# Patient Record
Sex: Male | Born: 1935
Health system: Southern US, Community
[De-identification: ages and names within clinical notes are randomized; demographics above are authoritative.]

## PROBLEM LIST (undated history)

## (undated) DIAGNOSIS — R0989 Other specified symptoms and signs involving the circulatory and respiratory systems: Secondary | ICD-10-CM

## (undated) DIAGNOSIS — I10 Essential (primary) hypertension: Secondary | ICD-10-CM

## (undated) DIAGNOSIS — J90 Pleural effusion, not elsewhere classified: Secondary | ICD-10-CM

## (undated) DIAGNOSIS — I517 Cardiomegaly: Secondary | ICD-10-CM

## (undated) DIAGNOSIS — I4510 Unspecified right bundle-branch block: Secondary | ICD-10-CM

## (undated) DIAGNOSIS — E785 Hyperlipidemia, unspecified: Secondary | ICD-10-CM

## (undated) DIAGNOSIS — I5189 Other ill-defined heart diseases: Secondary | ICD-10-CM

## (undated) DIAGNOSIS — I8390 Asymptomatic varicose veins of unspecified lower extremity: Secondary | ICD-10-CM

## (undated) DIAGNOSIS — I509 Heart failure, unspecified: Secondary | ICD-10-CM

## (undated) DIAGNOSIS — E119 Type 2 diabetes mellitus without complications: Secondary | ICD-10-CM

## (undated) DIAGNOSIS — I482 Chronic atrial fibrillation, unspecified: Secondary | ICD-10-CM

## (undated) DIAGNOSIS — R609 Edema, unspecified: Secondary | ICD-10-CM

## (undated) HISTORY — DX: Hyperlipidemia, unspecified: E78.5

## (undated) HISTORY — DX: Asymptomatic varicose veins of unspecified lower extremity: I83.90

## (undated) HISTORY — DX: Essential (primary) hypertension: I10

## (undated) HISTORY — DX: Pleural effusion, not elsewhere classified: J90

## (undated) HISTORY — PX: VIDEO ASSISTED THORACOSCOPY: SHX5073

## (undated) HISTORY — DX: Other ill-defined heart diseases: I51.89

## (undated) HISTORY — DX: Unspecified right bundle-branch block: I45.10

## (undated) HISTORY — PX: HEMORROIDECTOMY: SUR656

## (undated) HISTORY — DX: Chronic atrial fibrillation, unspecified: I48.20

## (undated) HISTORY — DX: Heart failure, unspecified: I50.9

## (undated) HISTORY — DX: Other specified symptoms and signs involving the circulatory and respiratory systems: R09.89

## (undated) HISTORY — DX: Edema, unspecified: R60.9

## (undated) HISTORY — DX: Type 2 diabetes mellitus without complications: E11.9

## (undated) HISTORY — DX: Cardiomegaly: I51.7

---

## 2008-10-06 DIAGNOSIS — I4510 Unspecified right bundle-branch block: Secondary | ICD-10-CM

## 2008-10-06 HISTORY — DX: Unspecified right bundle-branch block: I45.10

## 2009-07-23 ENCOUNTER — Inpatient Hospital Stay (HOSPITAL_COMMUNITY): Admission: EM | Admit: 2009-07-23 | Discharge: 2009-07-27 | Payer: Self-pay | Admitting: Emergency Medicine

## 2009-07-24 ENCOUNTER — Encounter (INDEPENDENT_AMBULATORY_CARE_PROVIDER_SITE_OTHER): Payer: Self-pay | Admitting: Internal Medicine

## 2009-07-24 DIAGNOSIS — I509 Heart failure, unspecified: Secondary | ICD-10-CM

## 2009-07-24 HISTORY — DX: Heart failure, unspecified: I50.9

## 2009-07-26 ENCOUNTER — Encounter (INDEPENDENT_AMBULATORY_CARE_PROVIDER_SITE_OTHER): Payer: Self-pay | Admitting: Internal Medicine

## 2009-07-30 ENCOUNTER — Ambulatory Visit (HOSPITAL_COMMUNITY): Admission: RE | Admit: 2009-07-30 | Discharge: 2009-07-30 | Payer: Self-pay | Admitting: Internal Medicine

## 2009-08-03 ENCOUNTER — Ambulatory Visit (HOSPITAL_COMMUNITY): Admission: RE | Admit: 2009-08-03 | Discharge: 2009-08-03 | Payer: Self-pay | Admitting: Internal Medicine

## 2009-08-13 ENCOUNTER — Encounter: Admission: RE | Admit: 2009-08-13 | Discharge: 2009-08-13 | Payer: Self-pay | Admitting: Cardiology

## 2010-03-14 ENCOUNTER — Ambulatory Visit
Admission: RE | Admit: 2010-03-14 | Discharge: 2010-03-14 | Disposition: A | Discharge status: Home / Self Care | Payer: PRIVATE HEALTH INSURANCE | Source: Ambulatory Visit | Attending: Internal Medicine | Admitting: Internal Medicine

## 2010-03-14 ENCOUNTER — Other Ambulatory Visit: Payer: Self-pay | Admitting: Internal Medicine

## 2010-03-14 DIAGNOSIS — R0600 Dyspnea, unspecified: Secondary | ICD-10-CM

## 2010-03-20 DIAGNOSIS — I8390 Asymptomatic varicose veins of unspecified lower extremity: Secondary | ICD-10-CM

## 2010-03-20 HISTORY — DX: Asymptomatic varicose veins of unspecified lower extremity: I83.90

## 2010-03-30 LAB — GLUCOSE, CAPILLARY: Glucose-Capillary: 129 mg/dL — ABNORMAL HIGH (ref 70–99)

## 2010-03-30 LAB — PROTIME-INR: Prothrombin Time: 20.9 seconds — ABNORMAL HIGH (ref 11.6–15.2)

## 2010-03-31 LAB — BODY FLUID CULTURE: Culture: NO GROWTH

## 2010-03-31 LAB — DIFFERENTIAL
Basophils Absolute: 0 10*3/uL (ref 0.0–0.1)
Basophils Relative: 1 % (ref 0–1)
Eosinophils Absolute: 0.1 10*3/uL (ref 0.0–0.7)
Eosinophils Relative: 1 % (ref 0–5)
Lymphocytes Relative: 19 % (ref 12–46)
Monocytes Relative: 7 % (ref 3–12)
Neutrophils Relative %: 72 % (ref 43–77)

## 2010-03-31 LAB — CK TOTAL AND CKMB (NOT AT ARMC): CK, MB: 1.8 ng/mL (ref 0.3–4.0)

## 2010-03-31 LAB — LIPID PANEL
LDL Cholesterol: 33 mg/dL (ref 0–99)
Total CHOL/HDL Ratio: 2 RATIO
Triglycerides: 53 mg/dL (ref <150)
VLDL: 11 mg/dL (ref 0–40)

## 2010-03-31 LAB — BASIC METABOLIC PANEL
BUN: 12 mg/dL (ref 6–23)
Calcium: 9 mg/dL (ref 8.4–10.5)
GFR calc Af Amer: >60 mL/min (ref >60)
GFR calc Af Amer: >60 mL/min (ref >60)
GFR calc non Af Amer: 51 mL/min — ABNORMAL LOW (ref >60)
GFR calc non Af Amer: >60 mL/min (ref >60)
Potassium: 3.4 mEq/L — ABNORMAL LOW (ref 3.5–5.1)
Sodium: 140 mEq/L (ref 135–145)

## 2010-03-31 LAB — CBC
HCT: 42.8 % (ref 39.0–52.0)
Hemoglobin: 14.1 g/dL (ref 13.0–17.0)
MCH: 31.4 pg (ref 26.0–34.0)
MCHC: 33.4 g/dL (ref 30.0–36.0)
MCHC: 33.4 g/dL (ref 30.0–36.0)
MCV: 94 fL (ref 78.0–100.0)
Platelets: 110 10*3/uL — ABNORMAL LOW (ref 150–400)
RDW: 16.6 % — ABNORMAL HIGH (ref 11.5–15.5)
RDW: 16.9 % — ABNORMAL HIGH (ref 11.5–15.5)

## 2010-03-31 LAB — COMPREHENSIVE METABOLIC PANEL
AST: 21 U/L (ref 0–37)
Albumin: 3.2 g/dL — ABNORMAL LOW (ref 3.5–5.2)
Calcium: 8.6 mg/dL (ref 8.4–10.5)
Chloride: 103 mEq/L (ref 96–112)
GFR calc Af Amer: >60 mL/min (ref >60)
GFR calc non Af Amer: >60 mL/min (ref >60)
Total Bilirubin: 0.6 mg/dL (ref 0.3–1.2)

## 2010-03-31 LAB — HEPATIC FUNCTION PANEL
AST: 24 U/L (ref 0–37)
Albumin: 3.3 g/dL — ABNORMAL LOW (ref 3.5–5.2)
Bilirubin, Direct: 0.3 mg/dL (ref 0.0–0.3)

## 2010-03-31 LAB — URINALYSIS, ROUTINE W REFLEX MICROSCOPIC
Bilirubin Urine: NEGATIVE
Hgb urine dipstick: NEGATIVE
Protein, ur: NEGATIVE mg/dL
Specific Gravity, Urine: 1.02 (ref 1.005–1.030)
pH: 5.5 (ref 5.0–8.0)

## 2010-03-31 LAB — GLUCOSE, CAPILLARY
Glucose-Capillary: 126 mg/dL — ABNORMAL HIGH (ref 70–99)
Glucose-Capillary: 130 mg/dL — ABNORMAL HIGH (ref 70–99)
Glucose-Capillary: 136 mg/dL — ABNORMAL HIGH (ref 70–99)
Glucose-Capillary: 160 mg/dL — ABNORMAL HIGH (ref 70–99)
Glucose-Capillary: 190 mg/dL — ABNORMAL HIGH (ref 70–99)
Glucose-Capillary: 85 mg/dL (ref 70–99)
Glucose-Capillary: 86 mg/dL (ref 70–99)
Glucose-Capillary: 88 mg/dL (ref 70–99)

## 2010-03-31 LAB — CARDIAC PANEL(CRET KIN+CKTOT+MB+TROPI)
CK, MB: 1.5 ng/mL (ref 0.3–4.0)
CK, MB: 1.5 ng/mL (ref 0.3–4.0)
Relative Index: INVALID (ref 0.0–2.5)
Relative Index: INVALID (ref 0.0–2.5)
Troponin I: 0.07 ng/mL — ABNORMAL HIGH (ref 0.00–0.06)
Troponin I: 0.07 ng/mL — ABNORMAL HIGH (ref 0.00–0.06)

## 2010-03-31 LAB — TROPONIN I: Troponin I: 0.05 ng/mL (ref 0.00–0.06)

## 2010-03-31 LAB — HEMOGLOBIN A1C: Mean Plasma Glucose: 120 mg/dL — ABNORMAL HIGH (ref <117)

## 2010-03-31 LAB — URINE MICROSCOPIC-ADD ON

## 2010-03-31 LAB — BODY FLUID CELL COUNT WITH DIFFERENTIAL
Neutrophil Count, Fluid: 2 % (ref 0–25)
Total Nucleated Cell Count, Fluid: 740 cu mm (ref 0–1000)

## 2010-03-31 LAB — BRAIN NATRIURETIC PEPTIDE: Pro B Natriuretic peptide (BNP): 432 pg/mL — ABNORMAL HIGH (ref 0.0–100.0)

## 2010-03-31 LAB — LACTATE DEHYDROGENASE, PLEURAL OR PERITONEAL FLUID

## 2010-04-18 ENCOUNTER — Encounter: Payer: Self-pay | Admitting: Pulmonary Disease

## 2010-04-22 ENCOUNTER — Ambulatory Visit (INDEPENDENT_AMBULATORY_CARE_PROVIDER_SITE_OTHER): Payer: PRIVATE HEALTH INSURANCE | Admitting: Pulmonary Disease

## 2010-04-22 ENCOUNTER — Encounter: Payer: Self-pay | Admitting: Pulmonary Disease

## 2010-04-22 VITALS — BP 118/72 | HR 55 | Temp 97.7°F | Ht 69.0 in | Wt 244.0 lb

## 2010-04-22 DIAGNOSIS — I4891 Unspecified atrial fibrillation: Secondary | ICD-10-CM

## 2010-04-22 DIAGNOSIS — J90 Pleural effusion, not elsewhere classified: Secondary | ICD-10-CM

## 2010-04-22 DIAGNOSIS — I519 Heart disease, unspecified: Secondary | ICD-10-CM

## 2010-04-22 DIAGNOSIS — I5189 Other ill-defined heart diseases: Secondary | ICD-10-CM

## 2010-04-22 NOTE — Progress Notes (Signed)
  Subjective:    Patient ID: Alex [name], male    DOB: 07-23-1920, 77 y.o.   MRN: [id number]  HPI The pt is an 77y/o male who I have been asked to see for a chronic right effusion.  This was first noted in summer of 2011, and thoracentesis at that time c/w transudate but had lymphocytosis on differential.  He had residual effusion after tap, but over time the fluid has re-accumulated.  His most recent cxr shows moderate right effusion that has enlarged from his usual baseline.  The pt denies chest discomfort, but feels more fatigued with a little more doe.  He denies any cough, congestion, or mucus.  He has persistent LE edema, and a h/o CAF with chronic diastolic heart failure.  He denies any h/o TB exposure, and has never had a ppd placed.    Review of Systems  Constitutional: Negative for fever and unexpected weight change.  HENT: Positive for congestion, rhinorrhea and postnasal drip. Negative for ear pain, nosebleeds, sore throat, sneezing, trouble swallowing, dental problem and sinus pressure.   Eyes: Negative for redness and itching.  Respiratory: Positive for cough and shortness of breath. Negative for chest tightness and wheezing.   Cardiovascular: Negative for palpitations and leg swelling.  Gastrointestinal: Negative for nausea and vomiting.  Genitourinary: Negative for dysuria.  Musculoskeletal: Negative for joint swelling.  Skin: Negative for rash.  Neurological: Negative for headaches.  Hematological: Does not bruise/bleed easily.  Psychiatric/Behavioral: Negative for dysphoric mood. The patient is not nervous/anxious.        Objective:   Physical Exam Constitutional:  Well developed, no acute distress  HENT:  Nares patent without discharge  Oropharynx without exudate, palate and uvula are normal  Eyes:  Perrla, eomi, no scleral icterus  Neck:  No JVD, no TMG  Cardiovascular:  Irregular rhythm but CVR, no rubs or gallops.  No murmurs        Intact distal  pulses  Pulmonary :  Decreased bs in right base with dullness, no stridor or respiratory distress   No rales, rhonchi, or wheezing  Abdominal:  Soft, nondistended, bowel sounds present.  No tenderness noted.   Musculoskeletal:  + lower extremity edema noted.  Lymph Nodes:  No cervical lymphadenopathy noted  Skin:  No cyanosis noted  Neurologic:  Alert, appropriate, moves all 4 extremities without obvious deficit.         Assessment & Plan:

## 2010-04-22 NOTE — Patient Instructions (Signed)
Will send to radiology to have your fluid removed, and send for lab studies. Will place ppd, and return on wed to have checked. Will call you with results of fluid removal

## 2010-04-23 ENCOUNTER — Ambulatory Visit (HOSPITAL_COMMUNITY)
Admission: RE | Admit: 2010-04-23 | Discharge: 2010-04-23 | Disposition: A | Discharge status: Home / Self Care | Payer: Medicare Other | Source: Ambulatory Visit | Attending: Pulmonary Disease | Admitting: Pulmonary Disease

## 2010-04-23 ENCOUNTER — Other Ambulatory Visit: Payer: Self-pay | Admitting: Interventional Radiology

## 2010-04-23 ENCOUNTER — Other Ambulatory Visit: Payer: Self-pay | Admitting: Pulmonary Disease

## 2010-04-23 DIAGNOSIS — R0989 Other specified symptoms and signs involving the circulatory and respiratory systems: Secondary | ICD-10-CM | POA: Insufficient documentation

## 2010-04-23 DIAGNOSIS — J90 Pleural effusion, not elsewhere classified: Secondary | ICD-10-CM | POA: Insufficient documentation

## 2010-04-23 DIAGNOSIS — R0609 Other forms of dyspnea: Secondary | ICD-10-CM | POA: Insufficient documentation

## 2010-04-23 LAB — LACTATE DEHYDROGENASE, PLEURAL OR PERITONEAL FLUID: LD, Fluid: 109 U/L — ABNORMAL HIGH (ref 3–23)

## 2010-04-23 LAB — BODY FLUID CELL COUNT WITH DIFFERENTIAL: Total Nucleated Cell Count, Fluid: 2543 cu mm — ABNORMAL HIGH (ref 0–1000)

## 2010-04-23 LAB — PROTEIN, BODY FLUID: Total protein, fluid: 3.2 g/dL

## 2010-04-23 LAB — GLUCOSE, SEROUS FLUID: Glucose, Fluid: 88 mg/dL

## 2010-04-24 ENCOUNTER — Encounter: Payer: Self-pay | Admitting: Pulmonary Disease

## 2010-04-24 DIAGNOSIS — I4891 Unspecified atrial fibrillation: Secondary | ICD-10-CM | POA: Insufficient documentation

## 2010-04-24 DIAGNOSIS — I5189 Other ill-defined heart diseases: Secondary | ICD-10-CM | POA: Insufficient documentation

## 2010-04-24 LAB — TB SKIN TEST: Induration: 0

## 2010-04-24 NOTE — Progress Notes (Signed)
Addended by: Arman Filter on: 04/24/2010 04:51 PM   Modules accepted: Orders

## 2010-04-24 NOTE — Assessment & Plan Note (Signed)
The pt has a chronic right effusion from the summer of 2011 that has never resolved.  It was transudative in nature at last tap, though did have elevated cell count with lymphocytosis.  It has clearly enlarged from last cxr, and the pt is a little more dyspneic.  More than likely, this is due to chronic heart failure given the characteristics of the fluid and his cardiac history, however would also consider other causes of a lymphocytic effusion such as tuberculosis and lymphoma (although they are usually exudative).  Will schedule for repeat thoracentesis to help with symptoms, and to repeat various studies to try and pin down possible etiology.

## 2010-04-26 LAB — BODY FLUID CULTURE

## 2010-04-30 ENCOUNTER — Other Ambulatory Visit: Payer: Self-pay | Admitting: Pulmonary Disease

## 2010-04-30 DIAGNOSIS — T887XXA Unspecified adverse effect of drug or medicament, initial encounter: Secondary | ICD-10-CM

## 2010-04-30 DIAGNOSIS — J90 Pleural effusion, not elsewhere classified: Secondary | ICD-10-CM

## 2010-05-02 ENCOUNTER — Other Ambulatory Visit (INDEPENDENT_AMBULATORY_CARE_PROVIDER_SITE_OTHER): Payer: PRIVATE HEALTH INSURANCE

## 2010-05-02 DIAGNOSIS — T887XXA Unspecified adverse effect of drug or medicament, initial encounter: Secondary | ICD-10-CM

## 2010-05-02 LAB — BASIC METABOLIC PANEL
CO2: 30 mEq/L (ref 19–32)
Glucose, Bld: 142 mg/dL — ABNORMAL HIGH (ref 70–99)
Potassium: 4.3 mEq/L (ref 3.5–5.1)
Sodium: 141 mEq/L (ref 135–145)

## 2010-05-06 ENCOUNTER — Ambulatory Visit (INDEPENDENT_AMBULATORY_CARE_PROVIDER_SITE_OTHER)
Admission: RE | Admit: 2010-05-06 | Discharge: 2010-05-06 | Disposition: A | Discharge status: Home / Self Care | Payer: PRIVATE HEALTH INSURANCE | Source: Ambulatory Visit | Attending: Pulmonary Disease | Admitting: Pulmonary Disease

## 2010-05-06 DIAGNOSIS — J90 Pleural effusion, not elsewhere classified: Secondary | ICD-10-CM

## 2010-05-06 MED ORDER — IOHEXOL 300 MG/ML  SOLN
100.0000 mL | Freq: Once | INTRAMUSCULAR | Status: AC | PRN
  Administered 2010-05-06: 80 mL via INTRAVENOUS

## 2010-05-08 ENCOUNTER — Encounter: Payer: Self-pay | Admitting: Pulmonary Disease

## 2010-05-10 ENCOUNTER — Encounter: Payer: Self-pay | Admitting: Pulmonary Disease

## 2010-05-10 ENCOUNTER — Ambulatory Visit (INDEPENDENT_AMBULATORY_CARE_PROVIDER_SITE_OTHER): Payer: PRIVATE HEALTH INSURANCE | Admitting: Pulmonary Disease

## 2010-05-10 VITALS — BP 120/70 | HR 89 | Temp 98.4°F | Ht 69.0 in | Wt 246.2 lb

## 2010-05-10 DIAGNOSIS — I5189 Other ill-defined heart diseases: Secondary | ICD-10-CM

## 2010-05-10 DIAGNOSIS — J90 Pleural effusion, not elsewhere classified: Secondary | ICD-10-CM

## 2010-05-10 DIAGNOSIS — I519 Heart disease, unspecified: Secondary | ICD-10-CM

## 2010-05-10 NOTE — Assessment & Plan Note (Addendum)
The pt's right effusion has re-accumulated fairly quickly by ct, with compressive atx in RLL.  His recent tap shows a little more inflammation, but is still transudative.  It still has a significant lymphocytosis.  His ppd is negative.  At this point, it is unclear why this keeps recurring, and why a lymphocytic effusion.  Must consider a diagnosis of lymphoma, although tends to be more exudative usually.  Have considered possible chronic heart failure, but his echo last year was unimpressive except for diastolic dysfunction.  He does keep some LE edema.  At this point, are considering whether to retap for fluid to send for flow cytometry to r/o lymphoma and have cardiology work with him on more aggressive diuresis, versus referring to thoracic surgery for vats with pleural bx and possible talc pleurodesis to prevent recurrence.  The pt is 77y/o, on coumadin, and does have heart issues.  I would like to try the former first, and see how he does.  If his flow cytometry is normal, he will obviously need further workup.  If the fluid re-accumulates quickly despite attempts by cardiology to diurese more aggressively, would refer to thoracic surgery.

## 2010-05-10 NOTE — Patient Instructions (Signed)
Will tap your fluid off one more time, and send test for lymphoma Will get you an apptm with cardiology asap to help with getting fluid off IF the fluid comes back quickly, will need to go see thoracic surgeon regarding intervention Will call you once I get results of study back.

## 2010-05-10 NOTE — Progress Notes (Signed)
  Subjective:    Patient ID: Alex [name], male    DOB: 25-Nov-1920, 77 y.o.   MRN: [id number]  HPI The pt comes in today for f/u of his pleural effusion.  He has had since 07/2009, but has enlarged over time.  His recent thoracentesis showed primarily a transudate, but more inflammatory than last year.  He continues to have a lymphocytosis in the fluid, but his ppd is negative.  His f/u ct chest shows re-accumulation of the fluid, and rounded atx in RLL.  No significant LN.  The pt also has a h/o diastolic dysfunction, and does have persistent LE edema.     Review of Systems  Constitutional: Negative for fever and unexpected weight change.  HENT: Positive for rhinorrhea. Negative for ear pain, nosebleeds, congestion, sore throat, sneezing, trouble swallowing, dental problem, postnasal drip and sinus pressure.   Eyes: Negative for redness and itching.  Respiratory: Positive for shortness of breath. Negative for cough, chest tightness and wheezing.   Cardiovascular: Negative for palpitations and leg swelling.  Gastrointestinal: Negative for nausea and vomiting.  Genitourinary: Negative for dysuria.  Musculoskeletal: Negative for joint swelling.  Skin: Negative for rash.  Neurological: Negative for headaches.  Hematological: Does not bruise/bleed easily.  Psychiatric/Behavioral: Negative for dysphoric mood. The patient is not nervous/anxious.        Objective:   Physical Exam Ow male in nad Nares without discharge or purulence Chest with decreased bs right base with dullness, o/w clear Cor with cvr LE with 1+ edema bilat, no cyanosis  Alert and oriented, moves all 4        Assessment & Plan:

## 2010-05-14 ENCOUNTER — Other Ambulatory Visit: Payer: Self-pay | Admitting: Pulmonary Disease

## 2010-05-14 ENCOUNTER — Ambulatory Visit (HOSPITAL_COMMUNITY)
Admission: RE | Admit: 2010-05-14 | Discharge: 2010-05-14 | Disposition: A | Discharge status: Home / Self Care | Payer: Medicare Other | Source: Ambulatory Visit | Attending: Pulmonary Disease | Admitting: Pulmonary Disease

## 2010-05-14 ENCOUNTER — Other Ambulatory Visit: Payer: Self-pay | Admitting: Interventional Radiology

## 2010-05-14 DIAGNOSIS — Z9889 Other specified postprocedural states: Secondary | ICD-10-CM

## 2010-05-14 DIAGNOSIS — J90 Pleural effusion, not elsewhere classified: Secondary | ICD-10-CM | POA: Insufficient documentation

## 2010-05-22 ENCOUNTER — Encounter: Payer: Self-pay | Admitting: Pulmonary Disease

## 2010-05-22 DIAGNOSIS — J90 Pleural effusion, not elsewhere classified: Secondary | ICD-10-CM

## 2010-05-22 NOTE — Progress Notes (Signed)
Pt had greater than one liter of fluid removed again by radiology 05/14/10, and the flow cytometry did not show a monoclonal population of lymphs He has seen cardiology who has also increased his lasix to see if we can keep fluid from re-accumulating.  I have discussed results with pt, and he is to make an apptm to see me back in 4 weeks, and will get cxr same day.  If fluid comes back quickly, will need to see [place] for VATS

## 2010-05-23 ENCOUNTER — Telehealth: Payer: Self-pay | Admitting: Pulmonary Disease

## 2010-05-23 NOTE — Telephone Encounter (Signed)
[id number] x1 

## 2010-05-23 NOTE — Telephone Encounter (Signed)
Spoke with pt.  He states has appt with 2 different cards docs sched for June. He wants to know which on he should see.  I advised that KC wanted him to be seen by cards asap, and so should keep which ever appt is sooner. Pt verbalized understanding.

## 2010-06-18 ENCOUNTER — Encounter: Payer: Self-pay | Admitting: Pulmonary Disease

## 2010-06-20 ENCOUNTER — Encounter: Payer: Self-pay | Admitting: Pulmonary Disease

## 2010-06-20 ENCOUNTER — Ambulatory Visit (INDEPENDENT_AMBULATORY_CARE_PROVIDER_SITE_OTHER)
Admission: RE | Admit: 2010-06-20 | Discharge: 2010-06-20 | Disposition: A | Discharge status: Home / Self Care | Payer: Medicare Other | Source: Ambulatory Visit | Attending: Pulmonary Disease | Admitting: Pulmonary Disease

## 2010-06-20 ENCOUNTER — Ambulatory Visit (INDEPENDENT_AMBULATORY_CARE_PROVIDER_SITE_OTHER): Payer: Medicare Other | Admitting: Pulmonary Disease

## 2010-06-20 VITALS — BP 112/68 | HR 88 | Temp 98.2°F | Ht 69.0 in | Wt 237.4 lb

## 2010-06-20 DIAGNOSIS — J90 Pleural effusion, not elsewhere classified: Secondary | ICD-10-CM

## 2010-06-20 NOTE — Patient Instructions (Signed)
Will try and talk with Dr. Rennis Golden tomorrow about your fluid issues Will call you if able to speak with him

## 2010-06-20 NOTE — Progress Notes (Signed)
  Subjective:    Patient ID: Alex [name], male    DOB: March 07, 1920, 77 y.o.   MRN: [id number]  HPI The pt comes in today for f/u of his chronic right effusion.  This has been tapped multiple times, and recurs each time.  At the last visit, it was discussed whether to proceed with thoracic surgery vs trying to "tune him up" from a CV standpoint.  He has known diastolic dysfunction, and has had worsening LE edema of late.  He was referred back to his cardiologist, and per the pt he was diuresed more aggressively.  His LE edema has improved, but his pleural effusion is unchanged by cxr today.  At least the pt is asymptomatic.  He also tells me he may require cardioversion.   Review of Systems  Constitutional: Negative for fever and unexpected weight change.  HENT: Positive for rhinorrhea. Negative for ear pain, nosebleeds, congestion, sore throat, sneezing, trouble swallowing, dental problem, postnasal drip and sinus pressure.   Eyes: Negative for redness and itching.  Respiratory: Positive for cough. Negative for chest tightness, shortness of breath and wheezing.   Cardiovascular: Negative for palpitations and leg swelling.  Gastrointestinal: Negative for nausea and vomiting.  Genitourinary: Negative for dysuria.  Musculoskeletal: Negative for joint swelling.  Skin: Negative for rash.  Neurological: Negative for headaches.  Hematological: Does not bruise/bleed easily.  Psychiatric/Behavioral: Negative for dysphoric mood. The patient is not nervous/anxious.        Objective:   Physical Exam Ow male in nad Chest with decreased bs right base, no wheezing noted Cor with primarily regular rhythm, intermittent irreg. LE with 2+ edema, no cyanosis Alert, oriented, moves all 4        Assessment & Plan:

## 2010-06-25 ENCOUNTER — Encounter (INDEPENDENT_AMBULATORY_CARE_PROVIDER_SITE_OTHER): Payer: Medicare Other | Admitting: Thoracic Surgery

## 2010-06-25 DIAGNOSIS — J90 Pleural effusion, not elsewhere classified: Secondary | ICD-10-CM

## 2010-06-26 NOTE — Letter (Signed)
June 25, 2010  Barbaraann Share, MD, FCCP [address] [place] [address]  Re:  MIRL, Alex [name]               DOB:  [birth year]  Dear. Dr. Shelle Iron:  The patient was referred here for his recurrent right pleural effusion that has been going on for about 9 months.  On a CT scan done about a month ago, he could see rounded atelectasis in the right middle lobe and right lower lobe, multiple thoracentesis, he had no cancer.  Her is referred here for possible talc pleurodesis.  He has history of cardiac failure with chronic atrial fibrillation that was treated by Dr. Lynnea Ferrier until Dr. Daneil Dan left and he is 58 but very active.  PAST MEDICAL HISTORY:  MEDICATIONS:  He is on allopurinol, colchicine for gout, diltiazem, Vytorin, Lasix, glipizide, lisinopril, Actos, Valtrex and Coumadin 2.5 mg daily.  He has diabetes mellitus type 2, hypertension, congestive heart failure and hypercholesterolemia.  FAMILY HISTORY:  Noncontributory.  SOCIAL HISTORY:  He is married.  He does not drink alcohol on a regular basis.  REVIEW OF SYSTEMS:  GENERAL:  His weight has been stable. CARDIAC:  No angina or atrial fibrillation.  No previous MI. PULMONARY:  No shortness of breath with exertion and no hemoptysis. GI:  No nausea, vomiting, constipation, or diarrhea. GU:  No kidney disease, dysuria or frequent urination. VASCULAR:  No claudication, DVT, or TIA. NEUROLOGIC:  No dizziness, headaches, blackouts, or seizures. MUSCULOSKELETAL:  Chronic arthritis. PSYCHIATRIC:  No depression or nervousness. ENT:  No change in his eyesight or hearing. HEMATOLOGICAL:  No problems with bleeding or clotting disorders.  PHYSICAL EXAMINATION:  GENERAL:  He is a slightly obese Caucasian male in no acute distress.  VITAL SIGNS:  His blood pressure is 127/81, pulse 78, respirations 20, sats were 98%.  HEAD, EYES, EARS, NOSE AND THROAT: Unremarkable.  NECK:  Supple without thyromegaly.  CHEST:   Decreased breath sounds on the right side.  HEART:  Irregularly regular rhythm. No murmurs.  ABDOMEN:  Obese.  Bowel sounds are normal.  EXTREMITIES: Pulses are 2+ with 1+ edema.  NEUROLOGIC:  He is oriented x3.  Sensory and motor intact.  Cranial nerves intact.  Even though with the patient's age, I do not think he is good enough to understand short general anesthesia in which we will do a drainage of the pleural effusion, pleural biopsy, talc pleurodesis and we will leave a chest tube as well as a pleurectomy place.  I have discussed this with him and he agrees with procedure.  He will also stop the Coumadin in approximately 5 days prior to his procedure.  I appreciate the opportunity of seeing the patient.  Ines Bloomer, M.D. Electronically Signed  DPB/MEDQ  D:  06/25/2010  T:  06/26/2010  Job:  [id number]

## 2010-06-28 NOTE — Assessment & Plan Note (Signed)
The pt has a persistent/recurrent right effusion that is nonspecific, and really has not improved with diuresis.  The pt tells me he may undergo DCC, but I am not sure this is the solution.  I would like to at least set him up with thoracic surgery, and will try to get in touch with his cardiologist to discuss further.

## 2010-07-03 ENCOUNTER — Telehealth: Payer: Self-pay | Admitting: Pulmonary Disease

## 2010-07-03 ENCOUNTER — Encounter (HOSPITAL_COMMUNITY)
Admission: RE | Admit: 2010-07-03 | Discharge: 2010-07-03 | Disposition: A | Discharge status: Home / Self Care | Payer: Medicare Other | Source: Ambulatory Visit | Attending: Thoracic Surgery | Admitting: Thoracic Surgery

## 2010-07-03 LAB — COMPREHENSIVE METABOLIC PANEL
AST: 15 U/L (ref 0–37)
CO2: 28 mEq/L (ref 19–32)
Chloride: 101 mEq/L (ref 96–112)
Creatinine, Ser: 1.05 mg/dL (ref 0.50–1.35)
GFR calc non Af Amer: >60 mL/min (ref >60)
Total Bilirubin: 0.5 mg/dL (ref 0.3–1.2)

## 2010-07-03 LAB — URINALYSIS, ROUTINE W REFLEX MICROSCOPIC
Bilirubin Urine: NEGATIVE
Ketones, ur: NEGATIVE mg/dL
Nitrite: NEGATIVE
Urobilinogen, UA: 1 mg/dL (ref 0.0–1.0)

## 2010-07-03 LAB — BLOOD GAS, ARTERIAL
Acid-Base Excess: 3.5 mmol/L — ABNORMAL HIGH (ref 0.0–2.0)
Drawn by: 181601
Patient temperature: 98.6
TCO2: 28.9 mmol/L (ref 0–100)
pCO2 arterial: 43 mmHg (ref 35.0–45.0)
pH, Arterial: 7.424 (ref 7.350–7.450)

## 2010-07-03 LAB — APTT: aPTT: 41 seconds — ABNORMAL HIGH (ref 24–37)

## 2010-07-03 LAB — SURGICAL PCR SCREEN: Staphylococcus aureus: NEGATIVE

## 2010-07-03 LAB — CBC
HCT: 37.5 % — ABNORMAL LOW (ref 39.0–52.0)
MCHC: 33.6 g/dL (ref 30.0–36.0)
MCV: 85.4 fL (ref 78.0–100.0)
Platelets: 241 10*3/uL (ref 150–400)
RDW: 16 % — ABNORMAL HIGH (ref 11.5–15.5)

## 2010-07-03 NOTE — Telephone Encounter (Signed)
Faxed OV, Echo & CXR to Robbie (per [place]) @ [facility] ([phone]).

## 2010-07-04 ENCOUNTER — Telehealth: Payer: Self-pay | Admitting: Pulmonary Disease

## 2010-07-04 NOTE — Telephone Encounter (Signed)
OV note faxed to [facility] @ [phone]  07/04/10/km

## 2010-07-08 ENCOUNTER — Other Ambulatory Visit: Payer: Self-pay | Admitting: Thoracic Surgery

## 2010-07-08 ENCOUNTER — Inpatient Hospital Stay (HOSPITAL_COMMUNITY): Payer: Medicare Other

## 2010-07-08 ENCOUNTER — Ambulatory Visit (HOSPITAL_COMMUNITY)
Admission: RE | Admit: 2010-07-08 | Discharge: 2010-07-08 | Disposition: A | Discharge status: Home / Self Care | Payer: Medicare Other | Source: Ambulatory Visit | Attending: Thoracic Surgery | Admitting: Thoracic Surgery

## 2010-07-08 ENCOUNTER — Inpatient Hospital Stay (HOSPITAL_COMMUNITY)
Admission: RE | Admit: 2010-07-08 | Discharge: 2010-07-12 | DRG: 164 | Disposition: A | Discharge status: Home Health | Payer: Medicare Other | Source: Ambulatory Visit | Attending: Thoracic Surgery | Admitting: Thoracic Surgery

## 2010-07-08 DIAGNOSIS — J90 Pleural effusion, not elsewhere classified: Secondary | ICD-10-CM

## 2010-07-08 DIAGNOSIS — E7800 Pure hypercholesterolemia, unspecified: Secondary | ICD-10-CM | POA: Diagnosis present

## 2010-07-08 DIAGNOSIS — I509 Heart failure, unspecified: Secondary | ICD-10-CM | POA: Diagnosis present

## 2010-07-08 DIAGNOSIS — M109 Gout, unspecified: Secondary | ICD-10-CM | POA: Diagnosis present

## 2010-07-08 DIAGNOSIS — E119 Type 2 diabetes mellitus without complications: Secondary | ICD-10-CM | POA: Diagnosis present

## 2010-07-08 DIAGNOSIS — I4891 Unspecified atrial fibrillation: Secondary | ICD-10-CM | POA: Diagnosis present

## 2010-07-08 DIAGNOSIS — I5032 Chronic diastolic (congestive) heart failure: Secondary | ICD-10-CM | POA: Diagnosis present

## 2010-07-08 DIAGNOSIS — I10 Essential (primary) hypertension: Secondary | ICD-10-CM | POA: Diagnosis present

## 2010-07-08 LAB — GLUCOSE, CAPILLARY: Glucose-Capillary: 126 mg/dL — ABNORMAL HIGH (ref 70–99)

## 2010-07-08 LAB — TYPE AND SCREEN
ABO/RH(D): O POS
Antibody Screen: NEGATIVE

## 2010-07-08 LAB — ABO/RH: ABO/RH(D): O POS

## 2010-07-09 ENCOUNTER — Inpatient Hospital Stay (HOSPITAL_COMMUNITY): Payer: Medicare Other

## 2010-07-09 LAB — BASIC METABOLIC PANEL
CO2: 27 mEq/L (ref 19–32)
Chloride: 99 mEq/L (ref 96–112)
GFR calc Af Amer: >60 mL/min (ref >60)
Potassium: 3.7 mEq/L (ref 3.5–5.1)
Sodium: 133 mEq/L — ABNORMAL LOW (ref 135–145)

## 2010-07-09 LAB — PROTIME-INR: INR: 1.29 (ref 0.00–1.49)

## 2010-07-09 LAB — CBC
HCT: 34.7 % — ABNORMAL LOW (ref 39.0–52.0)
Hemoglobin: 11.7 g/dL — ABNORMAL LOW (ref 13.0–17.0)
RBC: 4.02 MIL/uL — ABNORMAL LOW (ref 4.22–5.81)
WBC: 14.4 10*3/uL — ABNORMAL HIGH (ref 4.0–10.5)

## 2010-07-09 LAB — POCT I-STAT 3, ART BLOOD GAS (G3+)
Patient temperature: 98.8
TCO2: 27 mmol/L (ref 0–100)
pH, Arterial: 7.443 (ref 7.350–7.450)

## 2010-07-09 LAB — GLUCOSE, CAPILLARY
Glucose-Capillary: 106 mg/dL — ABNORMAL HIGH (ref 70–99)
Glucose-Capillary: 125 mg/dL — ABNORMAL HIGH (ref 70–99)
Glucose-Capillary: 127 mg/dL — ABNORMAL HIGH (ref 70–99)

## 2010-07-10 ENCOUNTER — Inpatient Hospital Stay (HOSPITAL_COMMUNITY): Payer: Medicare Other

## 2010-07-10 LAB — PRO B NATRIURETIC PEPTIDE: Pro B Natriuretic peptide (BNP): 1975 pg/mL — ABNORMAL HIGH (ref 0–450)

## 2010-07-10 LAB — GLUCOSE, CAPILLARY: Glucose-Capillary: 131 mg/dL — ABNORMAL HIGH (ref 70–99)

## 2010-07-10 LAB — CBC
MCH: 29.1 pg (ref 26.0–34.0)
MCHC: 33.1 g/dL (ref 30.0–36.0)
Platelets: 155 10*3/uL (ref 150–400)
RBC: 3.57 MIL/uL — ABNORMAL LOW (ref 4.22–5.81)

## 2010-07-10 LAB — BASIC METABOLIC PANEL
Calcium: 8.1 mg/dL — ABNORMAL LOW (ref 8.4–10.5)
GFR calc non Af Amer: >60 mL/min (ref >60)
Sodium: 134 mEq/L — ABNORMAL LOW (ref 135–145)

## 2010-07-10 LAB — PROTIME-INR: Prothrombin Time: 19.5 seconds — ABNORMAL HIGH (ref 11.6–15.2)

## 2010-07-11 ENCOUNTER — Inpatient Hospital Stay (HOSPITAL_COMMUNITY): Payer: Medicare Other

## 2010-07-11 LAB — BODY FLUID CULTURE: Culture: NO GROWTH

## 2010-07-11 LAB — CBC
MCV: 87.5 fL (ref 78.0–100.0)
Platelets: 171 10*3/uL (ref 150–400)
RDW: 16.4 % — ABNORMAL HIGH (ref 11.5–15.5)
WBC: 10 10*3/uL (ref 4.0–10.5)

## 2010-07-11 LAB — GLUCOSE, CAPILLARY
Glucose-Capillary: 96 mg/dL (ref 70–99)
Glucose-Capillary: 51 mg/dL — ABNORMAL LOW (ref 70–99)
Glucose-Capillary: 64 mg/dL — ABNORMAL LOW (ref 70–99)

## 2010-07-11 LAB — BASIC METABOLIC PANEL
Calcium: 7.6 mg/dL — ABNORMAL LOW (ref 8.4–10.5)
Creatinine, Ser: 0.99 mg/dL (ref 0.50–1.35)
GFR calc non Af Amer: >60 mL/min (ref >60)
Glucose, Bld: 135 mg/dL — ABNORMAL HIGH (ref 70–99)
Sodium: 133 mEq/L — ABNORMAL LOW (ref 135–145)

## 2010-07-11 LAB — PROTIME-INR: INR: 1.48 (ref 0.00–1.49)

## 2010-07-11 LAB — PRO B NATRIURETIC PEPTIDE: Pro B Natriuretic peptide (BNP): 3224 pg/mL — ABNORMAL HIGH (ref 0–450)

## 2010-07-12 ENCOUNTER — Inpatient Hospital Stay (HOSPITAL_COMMUNITY): Payer: Medicare Other

## 2010-07-15 LAB — GLUCOSE, CAPILLARY: Glucose-Capillary: 131 mg/dL — ABNORMAL HIGH (ref 70–99)

## 2010-07-15 NOTE — H&P (Signed)
  Alex [name], Alex [name]               ACCOUNT NO.:  [id number]  MEDICAL RECORD NO.:  [id number]  LOCATION:                                 FACILITY:  PHYSICIAN:  Ines Bloomer, M.D. DATE OF BIRTH:  [birth year]  DATE OF ADMISSION: DATE OF DISCHARGE:                             HISTORY & PHYSICAL   CHIEF COMPLAINT:  Pleural effusion.  HISTORY OF PRESENT ILLNESS:  This is an 77 year old Caucasian male who has been treated with a history of cardiac failure and chronic atrial fibrillation, was found to have a right pleural effusion and underwent multiple thoracenteses which the cytology showed no cancer.  He was seen to have rounded atelectasis in the middle lobe and the lower lobe, referred here for possible talc pleurodesis by Dr. Marcelyn Bruins.  He has had no fever, chills, or excessive sputum.  He is very active.  MEDICATIONS:  He is on allopurinol, colchicine for gout, diltiazem, Vytorin, Lasix, glipizide for diabetes mellitus type 2, lisinopril for hypertension, Actos, Valtrex, and Coumadin which has been stopped.  He is not allergic to any medications.  PAST MEDICAL HISTORY:  He also has hypercholesterolemia.  FAMILY HISTORY:  Noncontributory.  SOCIAL HISTORY:  He is married, does not smoke or drink alcohol on a regular basis.  REVIEW OF SYSTEMS:  CONSTITUTIONAL:  His weight has been stable. CARDIAC:  See history of present illness.  No previous MI.  PULMONARY: He gets some shortness of breath with exertion.  No hemoptysis.  GI:  No nausea, vomiting, constipation, diarrhea.  GU:  No kidney disease, dysuria, or frequent urination.  VASCULAR:  No claudication, DVT, TIAs. NEUROLOGICAL:  No dizziness, headaches, blackouts, seizures. MUSCULOSKELETAL:  Arthritis.  PSYCHIATRIC:  No depression or numbness. EYE, ENT:  No changes in eyesight or hearing.  HEMATOLOGICAL:  He is on Coumadin, but no problems with clotting disorders.  PHYSICAL EXAMINATION:  GENERAL:  This is a  slightly obese Caucasian male in no acute distress. VITAL SIGNS:  His blood pressure is 127/81, pulse 78, respirations 20, sats were 98%. HEENT:  Head is atraumatic.  Eyes, pupils are equal and reactive to light and accommodation.  Extraocular movements normal.  Ears, tympanic membranes are intact.  Nose, there is no septal deviation.  Throat is without lesions. NECK:  Supple without thyromegaly.  There is no supraclavicular or axillary adenopathy. CHEST:  Clear to auscultation and percussion. HEART:  Regular sinus rhythm.  No murmurs. ABDOMEN:  Soft.  There is no hepatosplenomegaly. EXTREMITIES:  Pulses are 1+ and there is 1+ edema. NEUROLOGIC:  He is oriented x3.  Sensory and motor intact.  IMPRESSION: 1. Recurrent right pleural effusion. 2. Heart failure and chronic atrial fibrillation. 3. Hypertension. 4. Hypercholesterolemia. 5. Diabetes mellitus type 2. 6. Gout.  PLAN:  Right VATS, drainage of pleural effusion, pleurectomy and pleural biopsies and talc pleurodesis.     Ines Bloomer, M.D.     DPB/MEDQ  D:  07/07/2010  T:  07/07/2010  Job:  [id number]  Electronically Signed by Jovita Gamma M.D. on 07/15/2010 04:07:39 PM

## 2010-07-15 NOTE — Op Note (Signed)
  NAMELENORD, FRALIX               ACCOUNT NO.:  [id number]  MEDICAL RECORD NO.:  [id number]  LOCATION:  XRAY                         FACILITY:  [facility]  PHYSICIAN:  Ines Bloomer, M.D. DATE OF BIRTH:  [birth year]  DATE OF PROCEDURE: DATE OF DISCHARGE:                              OPERATIVE REPORT   PREOPERATIVE DIAGNOSIS:  Chronic right pleural effusion secondary to probable congestive heart failure.  POSTOPERATIVE DIAGNOSIS:  Chronic right pleural effusion secondary to probable congestive heart failure.  OPERATION PERFORMED:  Right VATS, drainage of pleural effusion, pleural biopsies, talc pleurodesis, insertion of PleurX catheter.  SURGEON:  Ines Bloomer, MD  ANESTHESIA:  General anesthesia.  After percutaneous insertion of all monitoring lines, this 77 year old patient underwent general anesthesia.  A dual-lumen tube was inserted. The patient was turned to the right lateral thoracotomy position.  He was prepped and draped in the usual sterile manner.  Trocar site was made at the seventh intercostal space to the midaxillary line and a 0- degree scope was inserted after draining about 1000-1500 mL of serosanguineous fluid.  Blood was sent for cytologies as well as culture.  There was some adhesions of the right lower lobe and middle lobe to the chest wall, so we made another trocar site at the posterior axillary line, this was at the ninth intercostal space and then with a Kaiser ring forceps took down these adhesions, we then took a picture of the pleura which appeared to have chronic inflammation, we did multiple biopsies were medially and laterally with a frozen section revealed chronic inflammation.  We made quite a bit of the chronic pleura in order to get as much of a pleurodesis as possible and we then inserted 5 g of talc through the posterior trocar site with 36 chest tube through the mid trocar site and a PleurX was inserted under direct vision through the  ninth intercostal space through another stab wound anteriorly.  We then tunneled the thorax from the anterior stab wound to the posterior trocar sites under direct visions with the scope, inserted the PleurX along the posterior sulcus.  The lung was then reexpanded. The chest tube was sutured in place with 0 silk.  The PleurX was sutured in place with 3-0 nylon at the entrance and exit sites.  The patient was then turned to the recovery room in stable condition.     Ines Bloomer, M.D.    DPB/MEDQ  D:  07/08/2010  T:  07/08/2010  Job:  [id number]  cc:   Barbaraann Share, MD,FCCP  Electronically Signed by Jovita Gamma M.D. on 07/15/2010 04:07:37 PM

## 2010-07-22 ENCOUNTER — Other Ambulatory Visit: Payer: Self-pay | Admitting: Thoracic Surgery

## 2010-07-22 DIAGNOSIS — J90 Pleural effusion, not elsewhere classified: Secondary | ICD-10-CM

## 2010-07-23 ENCOUNTER — Ambulatory Visit (INDEPENDENT_AMBULATORY_CARE_PROVIDER_SITE_OTHER): Payer: Self-pay | Admitting: Thoracic Surgery

## 2010-07-23 ENCOUNTER — Ambulatory Visit
Admission: RE | Admit: 2010-07-23 | Discharge: 2010-07-23 | Disposition: A | Discharge status: Home / Self Care | Payer: Medicare Other | Source: Ambulatory Visit | Attending: Thoracic Surgery | Admitting: Thoracic Surgery

## 2010-07-23 DIAGNOSIS — J90 Pleural effusion, not elsewhere classified: Secondary | ICD-10-CM

## 2010-07-24 NOTE — Assessment & Plan Note (Signed)
OFFICE VISIT  Ney, Alex [name] DOB:  02/21/1920                                        July 23, 2010 CHART #:  [id number]  The patient's blood pressure is 111/72, pulse 66, respirations 18, sats were 95%.  His chest x-ray showed improvement in his effusion.  We went ahead and removed his Pleurx catheter since there had been almost minimal to no drainage, and we will cancel home health.  I will see him back again in 2 weeks with [name] chest x-ray.  He is doing well after his talc pleurodesis.  Ines Bloomer, M.D. Electronically Signed  DPB/MEDQ  D:  07/23/2010  T:  07/24/2010  Job:  [id number]  cc:   Barbaraann Share, MD,FCCP Merlene Laughter. Renae Gloss, M.D.

## 2010-07-25 ENCOUNTER — Emergency Department (HOSPITAL_COMMUNITY): Payer: Medicare Other

## 2010-07-25 ENCOUNTER — Emergency Department (HOSPITAL_COMMUNITY)
Admission: EM | Admit: 2010-07-25 | Discharge: 2010-07-25 | Disposition: A | Discharge status: Home / Self Care | Payer: Medicare Other | Attending: Emergency Medicine | Admitting: Emergency Medicine

## 2010-07-25 DIAGNOSIS — E119 Type 2 diabetes mellitus without complications: Secondary | ICD-10-CM | POA: Insufficient documentation

## 2010-07-25 DIAGNOSIS — G510 Bell's palsy: Secondary | ICD-10-CM | POA: Insufficient documentation

## 2010-07-25 DIAGNOSIS — I4891 Unspecified atrial fibrillation: Secondary | ICD-10-CM | POA: Insufficient documentation

## 2010-07-25 DIAGNOSIS — I10 Essential (primary) hypertension: Secondary | ICD-10-CM | POA: Insufficient documentation

## 2010-07-25 DIAGNOSIS — R209 Unspecified disturbances of skin sensation: Secondary | ICD-10-CM | POA: Insufficient documentation

## 2010-07-25 DIAGNOSIS — I4510 Unspecified right bundle-branch block: Secondary | ICD-10-CM | POA: Insufficient documentation

## 2010-07-25 DIAGNOSIS — R4789 Other speech disturbances: Secondary | ICD-10-CM | POA: Insufficient documentation

## 2010-07-25 LAB — CK TOTAL AND CKMB (NOT AT ARMC)
CK, MB: 1.5 ng/mL (ref 0.3–4.0)
Relative Index: INVALID (ref 0.0–2.5)
Total CK: 30 U/L (ref 7–232)

## 2010-07-25 LAB — DIFFERENTIAL
Basophils Absolute: 0.1 10*3/uL (ref 0.0–0.1)
Lymphocytes Relative: 13 % (ref 12–46)
Monocytes Absolute: 0.9 10*3/uL (ref 0.1–1.0)
Neutro Abs: 7.8 10*3/uL — ABNORMAL HIGH (ref 1.7–7.7)
Neutrophils Relative %: 77 % (ref 43–77)

## 2010-07-25 LAB — PROTIME-INR
INR: 2.37 — ABNORMAL HIGH (ref 0.00–1.49)
Prothrombin Time: 26.3 seconds — ABNORMAL HIGH (ref 11.6–15.2)

## 2010-07-25 LAB — CBC
HCT: 37.9 % — ABNORMAL LOW (ref 39.0–52.0)
Hemoglobin: 12.6 g/dL — ABNORMAL LOW (ref 13.0–17.0)
MCHC: 33.2 g/dL (ref 30.0–36.0)
RBC: 4.35 MIL/uL (ref 4.22–5.81)
WBC: 10.1 10*3/uL (ref 4.0–10.5)

## 2010-07-25 LAB — TROPONIN I: Troponin I: <0.3 ng/mL (ref <0.30)

## 2010-07-25 LAB — BASIC METABOLIC PANEL
BUN: 16 mg/dL (ref 6–23)
Calcium: 8.7 mg/dL (ref 8.4–10.5)
Creatinine, Ser: 0.9 mg/dL (ref 0.50–1.35)
GFR calc Af Amer: >60 mL/min (ref >60)
GFR calc non Af Amer: >60 mL/min (ref >60)

## 2010-07-29 NOTE — Discharge Summary (Signed)
Alex [name], Alex [name]               ACCOUNT NO.:  [id number]  MEDICAL RECORD NO.:  [id number]  LOCATION:                                 FACILITY:  PHYSICIAN:  Ines Bloomer, M.D. DATE OF BIRTH:  [birth year]  DATE OF ADMISSION:  07/08/2010 DATE OF DISCHARGE:  07/12/2010                              DISCHARGE SUMMARY   HISTORY:  The patient is an 77 year old white male, referred to Dr. Edwyna Shell for a chronic right pleural effusion.  He has undergone multiple thoracenteses and cytology has shown no malignancy.  He also has evidence of atelectasis in the right middle and lower lobe.  He was felt to be a candidate for video-assisted thoracoscopy for drainage of the effusion as well as pleural biopsies and talc pleurodesis with an insertion of a PleurX catheter.  He was admitted to this hospitalization for the procedure.  MEDICATIONS PRIOR TO ADMISSION:  Included: 1. Actos 30 mg p.o. daily. 2. Allopurinol 100 mg daily. 3. Coumadin 2.5 mg tablets one-half Tuesdays and Thursdays and one     tablet all other days. 4. Glipizide XL 10 mg daily. 5. Lasix 20 mg p.o. b.i.d. 6. Valacyclovir 1 gram daily p.r.n. for herpes outbreak. 7. Vytorin 10/40 mg one tablet daily. 8. He was also on lisinopril 20 mg daily.  ALLERGIES:  No known allergies.  PAST MEDICAL HISTORY:  Includes the following: 1. Chronic right pleural effusion as described above. 2. History of chronic atrial fibrillation. 3. History of diabetes mellitus type 2. 4. History of diastolic congestive heart failure. 5. History of hypercholesterolemia.  FAMILY HISTORY:  Noncontributory.  SOCIAL HISTORY:  He is married.  Does not smoke.  He does not drink alcohol on a regular basis.  REVIEW OF SYSTEMS:  Please see Dr. Scheryl Darter history and physical done at the time of admission.  PHYSICAL EXAM:  Please see Dr. Scheryl Darter history and physical done at the time of admission.  HOSPITAL COURSE:  The patient was admitted electively,  and on July 08, 2010, he underwent a right video-assisted thoracoscopy with drainage of effusion with pleural biopsies and talc pleurodesis with placement of a PleurX catheter.  He tolerated the procedure well and was taken to the postanesthesia care unit in stable condition.  POSTOPERATIVE HOSPITAL COURSE:  The patient has remained stable.  His chest tube was discontinued.  His most recent x-ray does show some slight increase in pleural effusion.  He does have significant elevation of his pro BNP [phone] on July 11, 2010.  He is receiving diuretics throughout the postoperative period.  His Coumadin has been restarted. He was seen in Cardiology consultation for assistance with his dysrhythmia and heart failure.  He has been started back on Cardizem which he apparently was on previously, but had been stopped.  Currently, he is on 60 mg q.8 h.  His incisions are healing well without evidence of infection.  His most recent INR is 1.48.  Home Health Nursing is being arranged for drainage of the PleurX catheter on a schedule of every Monday and Thursday.  Oxygen has been weaned.  He maintained adequate saturations on room air.  He is tolerating routine activities without significant difficulty.  His diabetes is under good control and will be restarted on his home regimen of oral medications.  He overall is felt to be stable for tentative discharge in the morning of July 12, 2010, pending morning round reevaluation.  MEDICATIONS AT DISCHARGE: 1. Cardizem SR 180 mg daily. 2. Ultram 50 mg one to two every 6 hours p.r.n. for pain. 3. Actos 30 mg daily. 4. Allopurinol 100 mg daily. 5. Coumadin same schedule as preadmission. 6. Glipizide XL 10 mg daily. 7. Lasix 20 mg b.i.d. 8. Valacyclovir p.r.n. 9. Vytorin 40 mg daily. 10.He currently has not been resumed on his lisinopril with blood     pressure under good control and with resumption of Cardizem.  We do     not want his blood pressure to  go too low.  It may require restart     as an outpatient.  INSTRUCTIONS:  The patient will receive written instructions in regard to medications, activity, diet, wound care, and follow-up.  FOLLOWUP:  Include Dr. Edwyna Shell on July 23, 2010 at 1215 with a chest x- ray to be obtained on same date.  We will also arrange the home nurse to discontinue chest tube sutures on July 18, 2010.  CONDITION ON DISCHARGE:  Stable and improving.  FINAL DIAGNOSIS:  Chronic right pleural effusion.  PATHOLOGY:  Pleural biopsies show inflamed mesothelial lined fibroadipose tissue and surface, fibrinopurulent exudate, no evidence of malignancy.  There is some inflamed fibrous tissue as well.  The cytology is also negative for malignancy.  OTHER DIAGNOSES:  Include: 1. Chronic atrial fibrillation. 2. Chronic hypercholesterolemia. 3. Diastolic congestive heart failure. 4. History of gout. 5. History of diabetes mellitus type 2. 6. History of herpes.     Rowe Clack, P.A.-C.   ______________________________ Ines Bloomer, M.D.    Sherryll Burger  D:  07/11/2010  T:  07/11/2010  Job:  [id number]  cc:   Barbaraann Share, MD,FCCP [place] Hilty, MD  Electronically Signed by Gershon Crane P.A.-C. on 07/23/2010 02:21:14 PM Electronically Signed by Jovita Gamma M.D. on 07/29/2010 04:04:03 PM

## 2010-08-04 LAB — FUNGUS CULTURE W SMEAR: Fungal Smear: NONE SEEN

## 2010-08-05 ENCOUNTER — Other Ambulatory Visit: Payer: Self-pay | Admitting: Thoracic Surgery

## 2010-08-05 DIAGNOSIS — J90 Pleural effusion, not elsewhere classified: Secondary | ICD-10-CM

## 2010-08-05 NOTE — Discharge Summary (Signed)
  NAMEMERRILL, Alex [name]               ACCOUNT NO.:  [id number]  MEDICAL RECORD NO.:  [id number]  LOCATION:  3305                         FACILITY:  [facility]  PHYSICIAN:  Ines Bloomer, M.D. DATE OF BIRTH:  1920-08-31  DATE OF ADMISSION:  07/08/2010 DATE OF DISCHARGE:  07/12/2010                              DISCHARGE SUMMARY   ADDENDUM  Alex [name] has been seen and evaluated on the morning of dictation and is deemed ready for discharge home at this time.  He has been noted to be hypoglycemic over the past 24 hours after being restarted on his home doses of Actos and glipizide with sugars down in the 50s and the patient has been symptomatic.  Cardiology had discussed the possibility of discontinuing Actos secondary to the increased risk of CHF exacerbation and had recommended starting metformin, however, according to the patient, his primary care physician recently started a new diabetes medication of which he does not know the name and is not listed on the medication reconciliation form.  In light of his hypoglycemia, we will plan to send him home on glipizide at his home dose.  We will discontinue the Actos, and we will have him follow up with his primary care physician next week for a recheck of his blood sugars and adjustment of his medications.  Also, his Pleurx catheter has been drained and a 100 mL of serous fluid was removed on July 11, 2010. Discharge medications are unchanged from the previously dictated discharge summary with the exception of the discontinuation of Actos. Discharge instructions and followups are also unchanged from the previously dictated discharge summary.  He will need to follow up with Dr. Renae Gloss or Dr. Mathews Robinsons PA next week for recheck of his blood sugars and diabetes medications.  He will also need to follow up with [facility] on July 15, 2010, for PT and INR.  He will follow up as directed with [facility] for Cardiology  followup.     Coral Ceo, P.A.   ______________________________ Ines Bloomer, M.D.    GC/MEDQ  D:  07/12/2010  T:  07/12/2010  Job:  [id number]  cc:   Merlene Laughter. Renae Gloss, M.D. Barbaraann Share, MD,FCCP [place] Hilty, MD [facility]  Electronically Signed by Coral Ceo P.A. on 08/02/2010 03:04:09 PM Electronically Signed by Jovita Gamma M.D. on 08/05/2010 02:42:39 PM

## 2010-08-06 ENCOUNTER — Ambulatory Visit (INDEPENDENT_AMBULATORY_CARE_PROVIDER_SITE_OTHER): Payer: Self-pay | Admitting: Thoracic Surgery

## 2010-08-06 ENCOUNTER — Ambulatory Visit
Admission: RE | Admit: 2010-08-06 | Discharge: 2010-08-06 | Disposition: A | Discharge status: Home / Self Care | Payer: Medicare Other | Source: Ambulatory Visit | Attending: Thoracic Surgery | Admitting: Thoracic Surgery

## 2010-08-06 DIAGNOSIS — J90 Pleural effusion, not elsewhere classified: Secondary | ICD-10-CM

## 2010-08-07 NOTE — Assessment & Plan Note (Signed)
OFFICE VISIT  Alex [name], Alex [name] A DOB:  10-30-1920                                        August 06, 2010 CHART #:  [id number]  The patient has developed Bell palsy a week ago and was treated in the emergency room and then by his medical doctor.  His chest x-ray is stable, shows a chronic changes in the right lower lobe.  His sats were 97%, blood pressure is 117/78, pulse 64, respirations 18.  His lungs are clear to auscultation and I will see him back again in 6 weeks with a chest x-ray for a final check.  Ines Bloomer, M.D. Electronically Signed  DPB/MEDQ  D:  08/06/2010  T:  08/07/2010  Job:  [id number]

## 2010-08-20 LAB — AFB CULTURE WITH SMEAR (NOT AT ARMC)

## 2010-09-11 ENCOUNTER — Other Ambulatory Visit: Payer: Self-pay | Admitting: Thoracic Surgery

## 2010-09-11 DIAGNOSIS — J90 Pleural effusion, not elsewhere classified: Secondary | ICD-10-CM

## 2010-09-17 ENCOUNTER — Encounter: Payer: Self-pay | Admitting: Thoracic Surgery

## 2010-09-17 ENCOUNTER — Ambulatory Visit (INDEPENDENT_AMBULATORY_CARE_PROVIDER_SITE_OTHER): Payer: Self-pay | Admitting: Thoracic Surgery

## 2010-09-17 ENCOUNTER — Ambulatory Visit
Admission: RE | Admit: 2010-09-17 | Discharge: 2010-09-17 | Disposition: A | Discharge status: Home / Self Care | Payer: Medicare Other | Source: Ambulatory Visit | Attending: Thoracic Surgery | Admitting: Thoracic Surgery

## 2010-09-17 VITALS — BP 144/95 | HR 76 | Resp 20 | Ht 69.0 in | Wt 220.0 lb

## 2010-09-17 DIAGNOSIS — J90 Pleural effusion, not elsewhere classified: Secondary | ICD-10-CM

## 2010-09-17 NOTE — Progress Notes (Signed)
HPI the patient returns after a pleural biopsy talc pleurodiesis. His incision is well healed. His chest x-ray shows no recurrence of his effusion. He is doing well and his Bell's palsy has resolved. I will see him back again in 2 months with a chest x-ray.  Current Outpatient Prescriptions  Medication Sig Dispense Refill  . allopurinol (ZYLOPRIM) 100 MG tablet Take 100 mg by mouth daily.        . colchicine 0.6 MG tablet Take 0.6 mg by mouth daily as needed.       . diltiazem (CARDIZEM) 30 MG tablet Take 1 tablet by mouth Three times a day.      . ezetimibe-simvastatin (VYTORIN) 10-40 MG per tablet Take 1 tablet by mouth daily.        . furosemide (LASIX) 40 MG tablet Take 20 mg by mouth 2 (two) times daily.       Marland Kitchen glipiZIDE (GLUCOTROL) 10 MG 24 hr tablet Take 10 mg by mouth daily.        Marland Kitchen lisinopril (PRINIVIL,ZESTRIL) 20 MG tablet Take 20 mg by mouth daily.        . valACYclovir (VALTREX) 1000 MG tablet Take 1 tablet by mouth as needed.      . warfarin (COUMADIN) 2.5 MG tablet Take as directed       . metFORMIN (GLUCOPHAGE-XR) 500 MG 24 hr tablet Take 1 tablet by mouth 1 day or 1 dose.          Physical Exam  Cardiovascular: Normal rate and normal heart sounds.   No murmur heard. Pulmonary/Chest: Effort normal and breath sounds normal.     Diagnostic tests: Chest x-ray shows no pleural effusion and scarring in the right pleura.   Impression: Status post pleurodiesis for her pleural effusion   Plan: Follow up in 2 months with chest x-ray

## 2010-10-22 DIAGNOSIS — I482 Chronic atrial fibrillation, unspecified: Secondary | ICD-10-CM

## 2010-10-22 HISTORY — DX: Chronic atrial fibrillation, unspecified: I48.20

## 2010-11-13 ENCOUNTER — Other Ambulatory Visit: Payer: Self-pay | Admitting: Thoracic Surgery

## 2010-11-13 DIAGNOSIS — J90 Pleural effusion, not elsewhere classified: Secondary | ICD-10-CM

## 2010-11-19 ENCOUNTER — Encounter: Payer: Self-pay | Admitting: Thoracic Surgery

## 2010-11-19 ENCOUNTER — Ambulatory Visit (INDEPENDENT_AMBULATORY_CARE_PROVIDER_SITE_OTHER): Payer: Medicare Other | Admitting: Thoracic Surgery

## 2010-11-19 ENCOUNTER — Ambulatory Visit
Admission: RE | Admit: 2010-11-19 | Discharge: 2010-11-19 | Disposition: A | Discharge status: Home / Self Care | Payer: Medicare Other | Source: Ambulatory Visit | Attending: Thoracic Surgery | Admitting: Thoracic Surgery

## 2010-11-19 VITALS — BP 138/81 | HR 72 | Resp 16 | Ht 69.0 in | Wt 220.0 lb

## 2010-11-19 DIAGNOSIS — J90 Pleural effusion, not elsewhere classified: Secondary | ICD-10-CM

## 2010-11-19 NOTE — Progress Notes (Signed)
HPI patient returns for final follow. Chest x-ray still shows some reaction in the right base he said no further medical problems. He is doing well overall. I will is discharge him back to his medical Dr.   Current Outpatient Prescriptions  Medication Sig Dispense Refill  . allopurinol (ZYLOPRIM) 100 MG tablet Take 100 mg by mouth daily.        . colchicine 0.6 MG tablet Take 0.6 mg by mouth daily as needed.       . diltiazem (CARDIZEM) 30 MG tablet Take 1 tablet by mouth Three times a day.      . ezetimibe-simvastatin (VYTORIN) 10-40 MG per tablet Take 1 tablet by mouth daily.        . furosemide (LASIX) 40 MG tablet Take 20 mg by mouth 2 (two) times daily.       Marland Kitchen glipiZIDE (GLUCOTROL) 10 MG 24 hr tablet Take 10 mg by mouth daily.        . metFORMIN (GLUCOPHAGE-XR) 500 MG 24 hr tablet Take 1 tablet by mouth 1 day or 1 dose.      . valACYclovir (VALTREX) 1000 MG tablet Take 1 tablet by mouth as needed.      . warfarin (COUMADIN) 2.5 MG tablet Take as directed       . lisinopril (PRINIVIL,ZESTRIL) 20 MG tablet Take 20 mg by mouth daily.           Review of Systems: Unchanged   Physical Exam  Cardiovascular: Normal rate.   Pulmonary/Chest: Effort normal and breath sounds normal. No respiratory distress.     Diagnostic Tests: Chest x-ray showed normal postoperative changes with some reaction in the right base   Impression: Status post decortication for fibrothorax  Plan: Followup by primary care physician

## 2011-07-11 IMAGING — CR DG CHEST 2V
2 series · 2 of 2 positions shown · non-contrast
Comparison: 05/14/2010

CLINICAL DATA: Evaluate for pleural effusion.

CHEST - 2 VIEW

[view not recorded (1 of 2)]
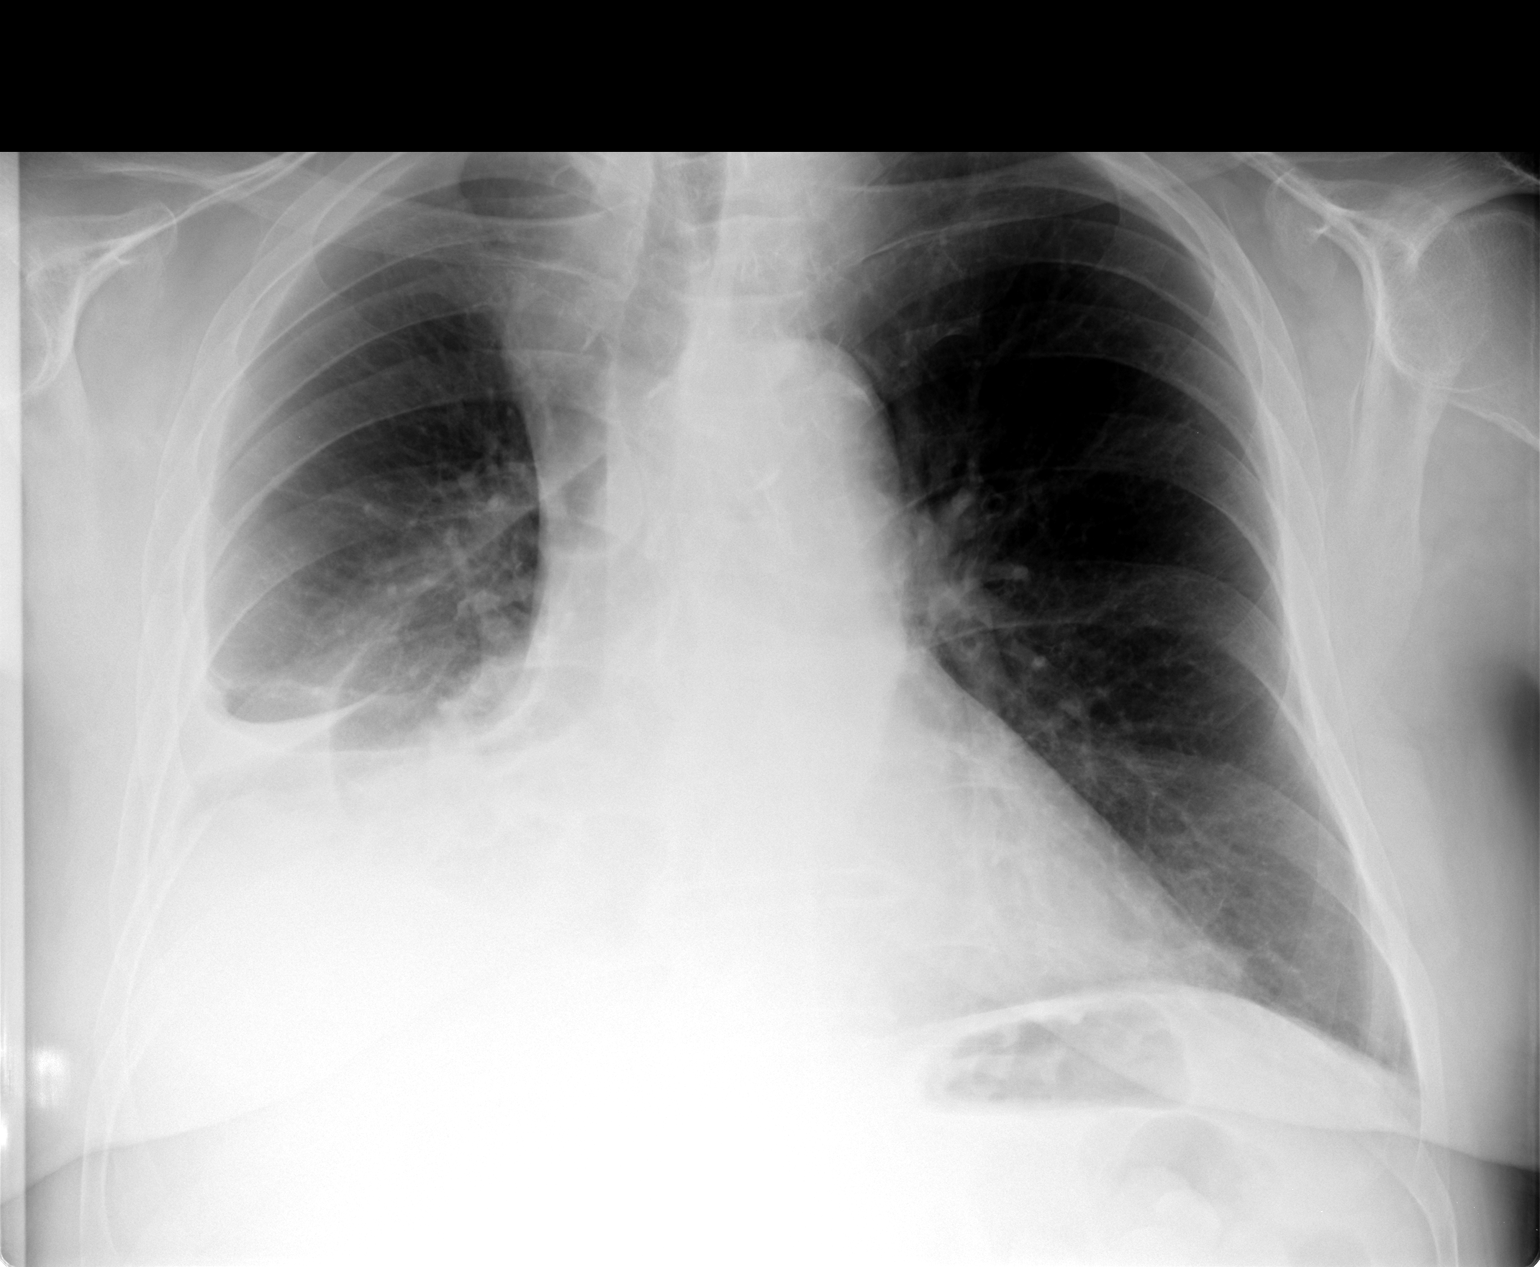

[view not recorded (2 of 2)]
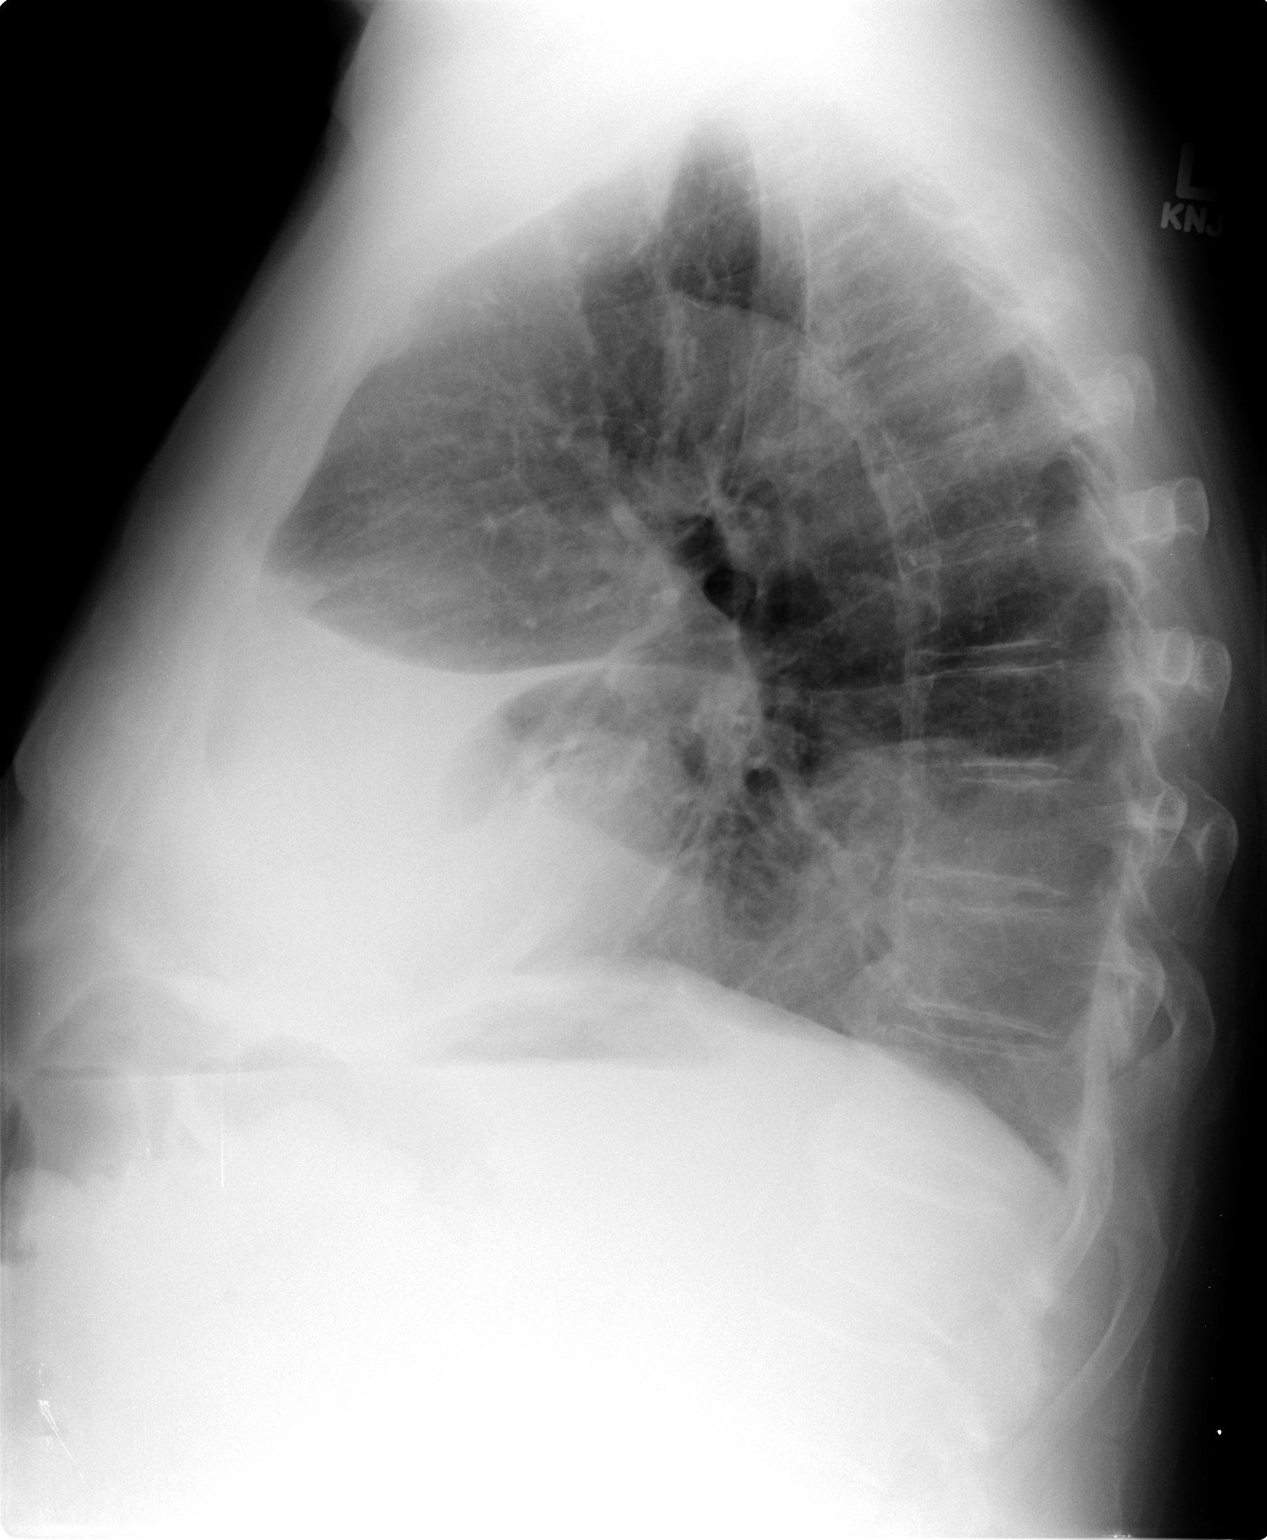

[2 of 2 positions shown; findings below may reference images not displayed]

FINDINGS: Moderate sized right-sided pleural effusion with
associated consolidation, likely compressive atelectasis.  This is
unchanged in size when compared to 05/14/2010.  The left lung is
clear as is the right upper lung.  Cardiomediastinal contours are
unchanged, mildly prominent.  Multilevel degenerative changes of
the thoracolumbar spine with flowing anterior osteophytes.  Mild
aortic arch atherosclerosis.
IMPRESSION: Moderate right pleural effusion has reaccumulated, now similar in
size to 05/14/2010.

## 2012-02-27 ENCOUNTER — Encounter: Payer: Self-pay | Admitting: Pharmacist Clinician (PhC)/ Clinical Pharmacy Specialist

## 2012-03-24 ENCOUNTER — Other Ambulatory Visit: Payer: Self-pay | Admitting: Cardiovascular Disease

## 2012-03-24 ENCOUNTER — Ambulatory Visit
Admission: RE | Admit: 2012-03-24 | Discharge: 2012-03-24 | Disposition: A | Discharge status: Home / Self Care | Payer: Medicare Other | Source: Ambulatory Visit | Attending: Cardiovascular Disease | Admitting: Cardiovascular Disease

## 2012-03-24 DIAGNOSIS — Z09 Encounter for follow-up examination after completed treatment for conditions other than malignant neoplasm: Secondary | ICD-10-CM

## 2012-03-31 ENCOUNTER — Ambulatory Visit: Payer: Self-pay | Admitting: Cardiovascular Disease

## 2012-03-31 DIAGNOSIS — I4891 Unspecified atrial fibrillation: Secondary | ICD-10-CM

## 2012-03-31 DIAGNOSIS — Z7901 Long term (current) use of anticoagulants: Secondary | ICD-10-CM

## 2012-05-04 ENCOUNTER — Encounter: Payer: Self-pay | Admitting: Cardiovascular Disease

## 2012-06-23 ENCOUNTER — Ambulatory Visit (INDEPENDENT_AMBULATORY_CARE_PROVIDER_SITE_OTHER): Payer: Medicare Other | Admitting: Pharmacist Clinician (PhC)/ Clinical Pharmacy Specialist

## 2012-06-23 ENCOUNTER — Other Ambulatory Visit: Payer: Self-pay | Admitting: Cardiovascular Disease

## 2012-06-23 VITALS — BP 156/82 | HR 76

## 2012-06-23 DIAGNOSIS — I4891 Unspecified atrial fibrillation: Secondary | ICD-10-CM

## 2012-06-23 DIAGNOSIS — Z7901 Long term (current) use of anticoagulants: Secondary | ICD-10-CM

## 2012-06-23 LAB — LIPID PANEL
Cholesterol: 164 mg/dL (ref 0–200)
Triglycerides: 116 mg/dL (ref <150)
VLDL: 23 mg/dL (ref 0–40)

## 2012-06-23 LAB — HEPATIC FUNCTION PANEL
ALT: 16 U/L (ref 0–53)
AST: 18 U/L (ref 0–37)
Alkaline Phosphatase: 85 U/L (ref 39–117)
Bilirubin, Direct: 0.2 mg/dL (ref 0.0–0.3)
Indirect Bilirubin: 0.9 mg/dL (ref 0.0–0.9)
Total Protein: 6.5 g/dL (ref 6.0–8.3)

## 2012-06-23 LAB — POCT INR: INR: 2.2

## 2012-07-02 ENCOUNTER — Encounter: Payer: Self-pay | Admitting: *Deleted

## 2012-07-02 NOTE — Progress Notes (Signed)
Quick Note:  Informed patient labwork WNL ______

## 2012-07-05 ENCOUNTER — Other Ambulatory Visit: Payer: Self-pay | Admitting: Cardiovascular Disease

## 2012-07-06 NOTE — Telephone Encounter (Signed)
Rx was sent to pharmacy electronically. 

## 2012-08-04 ENCOUNTER — Ambulatory Visit (INDEPENDENT_AMBULATORY_CARE_PROVIDER_SITE_OTHER): Payer: Medicare Other | Admitting: Pharmacist Clinician (PhC)/ Clinical Pharmacy Specialist

## 2012-08-04 VITALS — BP 124/80 | HR 72

## 2012-08-04 DIAGNOSIS — Z7901 Long term (current) use of anticoagulants: Secondary | ICD-10-CM

## 2012-08-04 DIAGNOSIS — I4891 Unspecified atrial fibrillation: Secondary | ICD-10-CM

## 2012-08-19 ENCOUNTER — Other Ambulatory Visit: Payer: Self-pay | Admitting: Cardiovascular Disease

## 2012-08-20 ENCOUNTER — Other Ambulatory Visit: Payer: Self-pay | Admitting: *Deleted

## 2012-08-20 NOTE — Telephone Encounter (Signed)
Rx was sent to pharmacy electronically. 

## 2012-08-30 ENCOUNTER — Other Ambulatory Visit: Payer: Self-pay | Admitting: Cardiovascular Disease

## 2012-09-15 ENCOUNTER — Ambulatory Visit (INDEPENDENT_AMBULATORY_CARE_PROVIDER_SITE_OTHER): Payer: Medicare Other | Admitting: Pharmacist Clinician (PhC)/ Clinical Pharmacy Specialist

## 2012-09-15 VITALS — BP 130/70 | HR 68

## 2012-09-15 DIAGNOSIS — I4891 Unspecified atrial fibrillation: Secondary | ICD-10-CM

## 2012-09-15 DIAGNOSIS — Z7901 Long term (current) use of anticoagulants: Secondary | ICD-10-CM

## 2012-09-15 LAB — POCT INR: INR: 2.1

## 2012-09-17 ENCOUNTER — Encounter: Payer: Self-pay | Admitting: Cardiovascular Disease

## 2012-09-17 ENCOUNTER — Ambulatory Visit (INDEPENDENT_AMBULATORY_CARE_PROVIDER_SITE_OTHER): Payer: Medicare Other | Admitting: Cardiovascular Disease

## 2012-09-17 VITALS — BP 154/100 | HR 69 | Ht 69.0 in | Wt 241.3 lb

## 2012-09-17 DIAGNOSIS — I5189 Other ill-defined heart diseases: Secondary | ICD-10-CM

## 2012-09-17 DIAGNOSIS — I519 Heart disease, unspecified: Secondary | ICD-10-CM

## 2012-09-17 DIAGNOSIS — I4510 Unspecified right bundle-branch block: Secondary | ICD-10-CM | POA: Insufficient documentation

## 2012-09-17 DIAGNOSIS — I10 Essential (primary) hypertension: Secondary | ICD-10-CM | POA: Insufficient documentation

## 2012-09-17 DIAGNOSIS — Z7901 Long term (current) use of anticoagulants: Secondary | ICD-10-CM

## 2012-09-17 DIAGNOSIS — I4891 Unspecified atrial fibrillation: Secondary | ICD-10-CM

## 2012-09-17 MED ORDER — AMLODIPINE BESYLATE 5 MG PO TABS: 5.0000 mg | ORAL_TABLET | Freq: Every day | ORAL | Status: DC

## 2012-09-17 NOTE — Patient Instructions (Signed)
Your physician recommends that you schedule a follow-up appointment in: 6 MONTHS.  Your physician has recommended you make the following change in your medication: INCREASE the amlodipine  To 5 mg. This prescription has already been sent to your pharmacy.

## 2012-09-17 NOTE — Progress Notes (Signed)
Patient ID: Alex [name], male   DOB: [birth year], 79 y.o.   MRN: [id number]     HPI: Alex [name], is a 79 y.o. male  presents to the office for six-month cardiology evaluation.  Alex [name] has a history of permanent atrial fibrillation for which Alex [name] is on Coumadin anticoagulation, history of hypertension with diastolic dysfunction and concentric left ventricular hypertrophy, as well as a history of hyperlipidemia and type 2 diabetes mellitus. Alex [name] has a history of ACE-induced cough remotely. Alex [name] also has a history of pleural effusion and previously underwent that procedure which was negative for malignancy.  Alex [name] tells me since I last saw him Alex [name] did have 2 episodes of gout. When I saw him last in March his heart rate was in the low 50s and at that time I reduced his Toprol dose from 50 mg to 37.5 mg but apparently Alex [name] has been taking this at 25 mg daily. Alex [name] tells me his blood pressure at home typically runs in the 140 range systolically. Alex [name] remains active. Alex [name] denies chest pain. Alex [name] denies shortness of breath. Alex [name] did twist his knee in June.  Past Medical History  Diagnosis Date  . Type II or unspecified type diabetes mellitus without mention of complication, not stated as uncontrolled   . Essential hypertension, malignant   . Abnormal chest sounds   . Edema   . CHF (congestive heart failure) 07/24/2009    echo - EF 55-65%; severe LVH; severe concentric hypertrophy; grade 1 diastolic dysfunction; aortic valve - mod regurgitation; R pleural effusion  . Pleural effusion on right   . Atrial fibrillation, chronic 10/22/2010    echo - EF >55%; impaired LV relaxation; LA mod dilated; RA mod dilated; mod mitral annular calcification; mod tricuspid regurgitation, RVsystolic pressure ;   . RBBB 10/06/2008    Myoview - EF 65%; normal perfusion all regions, no evidence of inducible myocaridal ischemia; normal perfusion study  . Varicose vein 03/20/2010    doppler - no evidence of thrombus,  thrombophlebitis, or venous insufficiency  . Diastolic dysfunction   . Left ventricular hypertrophy     concentric  . Dyslipidemia   . Pleural effusion     Past Surgical History  Procedure Laterality Date  . Hemorroidectomy    . Video assisted thoracoscopy      rt VATs , drainage of plueral effusion, pleural biopsies, talc pleurodesis    Allergies  Allergen Reactions  . Penicillins Hives    Current Outpatient Prescriptions  Medication Sig Dispense Refill  . allopurinol (ZYLOPRIM) 100 MG tablet Take 100 mg by mouth daily.       Marland Kitchen amLODipine (NORVASC) 2.5 MG tablet Take 2.5 mg by mouth daily.      Marland Kitchen atorvastatin (LIPITOR) 20 MG tablet Take 20 mg by mouth daily.      . colchicine 0.6 MG tablet Take 0.6 mg by mouth daily as needed.       . fluticasone (FLONASE) 50 MCG/ACT nasal spray Place 2 sprays into the nose as needed for rhinitis.      . furosemide (LASIX) 40 MG tablet Take one tablet by mouth twice daily  60 tablet  7  . glipiZIDE (GLUCOTROL) 10 MG 24 hr tablet Take 10 mg by mouth daily.       Marland Kitchen losartan (COZAAR) 100 MG tablet TAKE ONE TABLET BY MOUTH ONE TIME DAILY  30 tablet  9  . metFORMIN (GLUCOPHAGE-XR) 500 MG 24 hr tablet Take 1 tablet by mouth 1 day  or 1 dose.       . metoprolol succinate (TOPROL-XL) 25 MG 24 hr tablet Take 25 mg by mouth daily.      Marland Kitchen warfarin (COUMADIN) 2.5 MG tablet Follow directions provided by physician  90 tablet  1   No current facility-administered medications for this visit.    History   Social History  . Marital Status: Married    Spouse Name: N/A    Number of Children: N/A  . Years of Education: N/A   Occupational History  . retired    Social History Main Topics  . Smoking status: Never Smoker   . Smokeless tobacco: Never Used  . Alcohol Use: Yes     Comment: Occasional  . Drug Use: No  . Sexual Activity: Not on file   Other Topics Concern  . Not on file   Social History Narrative  . No narrative on file    History  reviewed. No pertinent family history.  ROS is negative for fevers, chills or night sweats. There is no fever chills or night sweats. Alex [name] appears younger than stated age. Alex [name] denies wheezing. Alex [name] denies tachycardia palpitations. Alex [name] denies presyncope or syncope. Alex [name] denies bleeding. Alex [name] denies significant claudication symptoms. Alex [name] denies paresthesias.  Other system review is negative.  PE BP 154/100  Pulse 69  Ht 5\' 9"  (1.753 m)  Wt 241 lb 4.8 oz (109.453 kg)  BMI 35.62 kg/m2  Repeat blood pressure was 130/86 General: Alert, oriented, no distress. Alex [name] appears much other than his stated age of 65. Alex [name] was returning 79 years old in December Skin: normal turgor, no rashes HEENT: Normocephalic, atraumatic. Pupils round and reactive; sclera anicteric;no lid lag.  Nose without nasal septal hypertrophy Mouth/Parynx benign; Mallinpatti scale 2 Neck: No JVD, no carotid briuts Lungs: clear to ausculatation and percussion; no wheezing or rales Heart: RRR, s1 s2 normal 1/6 systolic murmur. No rub. Abdomen: soft, nontender; no hepatosplenomehaly, BS+; abdominal aorta nontender and not dilated by palpation. Pulses 2+ Extremities: no clubbing cyanosis or edema, Homan's sign negative  Neurologic: grossly nonfocal  ECG: Atrial fibrillation with a ventricular response now in the mid 60s. Right bundle branch block with repolarization changes, unchanged.  LABS:  BMET    Component Value Date/Time   NA 138 07/25/2010 1147   K 3.5 07/25/2010 1147   CL 99 07/25/2010 1147   CO2 28 07/25/2010 1147   GLUCOSE 221* 07/25/2010 1147   BUN 16 07/25/2010 1147   CREATININE 0.90 07/25/2010 1147   CALCIUM 8.7 07/25/2010 1147   GFRNONAA >60 07/25/2010 1147   GFRAA >60 07/25/2010 1147     Hepatic Function Panel     Component Value Date/Time   PROT 6.5 06/23/2012 1019   ALBUMIN 4.1 06/23/2012 1019   AST 18 06/23/2012 1019   ALT 16 06/23/2012 1019   ALKPHOS 85 06/23/2012 1019   BILITOT 1.1 06/23/2012 1019   BILIDIR 0.2  06/23/2012 1019   IBILI 0.9 06/23/2012 1019     CBC    Component Value Date/Time   WBC 10.1 07/25/2010 1147   RBC 4.35 07/25/2010 1147   HGB 12.6* 07/25/2010 1147   HCT 37.9* 07/25/2010 1147   PLT 394 07/25/2010 1147   MCV 87.1 07/25/2010 1147   MCH 29.0 07/25/2010 1147   MCHC 33.2 07/25/2010 1147   RDW 15.1 07/25/2010 1147   LYMPHSABS 1.3 07/25/2010 1147   MONOABS 0.9 07/25/2010 1147   EOSABS 0.1 07/25/2010 1147   BASOSABS 0.1 07/25/2010 1147  BNP    Component Value Date/Time   PROBNP 3224.0* 07/11/2010 0400    Lipid Panel     Component Value Date/Time   CHOL 164 06/23/2012 1019   TRIG 116 06/23/2012 1019   HDL 56 06/23/2012 1019   CHOLHDL 2.9 06/23/2012 1019   VLDL 23 06/23/2012 1019   LDLCALC 85 06/23/2012 1019     RADIOLOGY: No results found.    ASSESSMENT AND PLAN: From a cardiac standpoint, Alex [name] continues to be fairly stable. His resting pulse is now in the mid 60s and Alex [name] seemed to tolerate this better on his reduced dose of Toprol. However, Alex [name] does have some blood pressure lability with his initial blood pressure when taken by the nurse being 154/100 although repeat blood pressure by me was 130/86. I recommend try titration of his amlodipine from 2.5 mg to 5 mg for optimal blood pressure control. Alex [name] is tolerating his atrial fibrillation. Coumadin is being monitored in our Coumadin clinic. As long as Alex [name] remains stable, I will see him in 6 months for followup evaluation.     Lennette Bihari, MD, [facility]  09/17/2012 12:02 PM

## 2012-10-27 ENCOUNTER — Ambulatory Visit: Payer: Medicare Other | Admitting: Pharmacist Clinician (PhC)/ Clinical Pharmacy Specialist

## 2012-10-29 ENCOUNTER — Ambulatory Visit (INDEPENDENT_AMBULATORY_CARE_PROVIDER_SITE_OTHER): Payer: Medicare Other | Admitting: Pharmacist Clinician (PhC)/ Clinical Pharmacy Specialist

## 2012-10-29 VITALS — BP 160/80 | HR 64

## 2012-10-29 DIAGNOSIS — Z7901 Long term (current) use of anticoagulants: Secondary | ICD-10-CM

## 2012-10-29 DIAGNOSIS — I4891 Unspecified atrial fibrillation: Secondary | ICD-10-CM

## 2012-12-08 ENCOUNTER — Other Ambulatory Visit: Payer: Self-pay | Admitting: Pharmacist Clinician (PhC)/ Clinical Pharmacy Specialist

## 2012-12-13 ENCOUNTER — Ambulatory Visit (INDEPENDENT_AMBULATORY_CARE_PROVIDER_SITE_OTHER): Payer: Medicare Other | Admitting: Pharmacist Clinician (PhC)/ Clinical Pharmacy Specialist

## 2012-12-13 VITALS — BP 144/68 | HR 76

## 2012-12-13 DIAGNOSIS — I4891 Unspecified atrial fibrillation: Secondary | ICD-10-CM

## 2012-12-13 DIAGNOSIS — Z7901 Long term (current) use of anticoagulants: Secondary | ICD-10-CM

## 2012-12-31 ENCOUNTER — Encounter: Payer: Self-pay | Admitting: Cardiovascular Disease

## 2013-01-24 ENCOUNTER — Ambulatory Visit (INDEPENDENT_AMBULATORY_CARE_PROVIDER_SITE_OTHER): Payer: Medicare Other | Admitting: Pharmacist Clinician (PhC)/ Clinical Pharmacy Specialist

## 2013-01-24 VITALS — BP 172/80 | HR 69

## 2013-01-24 DIAGNOSIS — I4891 Unspecified atrial fibrillation: Secondary | ICD-10-CM

## 2013-01-24 DIAGNOSIS — Z7901 Long term (current) use of anticoagulants: Secondary | ICD-10-CM

## 2013-01-24 LAB — POCT INR: INR: 2.5

## 2013-03-07 ENCOUNTER — Ambulatory Visit (INDEPENDENT_AMBULATORY_CARE_PROVIDER_SITE_OTHER): Payer: Medicare Other | Admitting: Pharmacist Clinician (PhC)/ Clinical Pharmacy Specialist

## 2013-03-07 VITALS — BP 132/70 | HR 76

## 2013-03-07 DIAGNOSIS — Z7901 Long term (current) use of anticoagulants: Secondary | ICD-10-CM

## 2013-03-07 DIAGNOSIS — I4891 Unspecified atrial fibrillation: Secondary | ICD-10-CM

## 2013-03-07 LAB — POCT INR: INR: 3.5

## 2013-03-22 ENCOUNTER — Other Ambulatory Visit: Payer: Self-pay | Admitting: Cardiovascular Disease

## 2013-03-22 NOTE — Telephone Encounter (Signed)
Rx was sent to pharmacy electronically. 

## 2013-03-30 ENCOUNTER — Ambulatory Visit (INDEPENDENT_AMBULATORY_CARE_PROVIDER_SITE_OTHER): Payer: Medicare Other | Admitting: Pharmacist Clinician (PhC)/ Clinical Pharmacy Specialist

## 2013-03-30 ENCOUNTER — Ambulatory Visit (INDEPENDENT_AMBULATORY_CARE_PROVIDER_SITE_OTHER): Payer: Medicare Other | Admitting: Cardiovascular Disease

## 2013-03-30 ENCOUNTER — Encounter: Payer: Self-pay | Admitting: Cardiovascular Disease

## 2013-03-30 VITALS — BP 120/72 | HR 63 | Ht 70.0 in | Wt 240.4 lb

## 2013-03-30 DIAGNOSIS — I4891 Unspecified atrial fibrillation: Secondary | ICD-10-CM

## 2013-03-30 DIAGNOSIS — I519 Heart disease, unspecified: Secondary | ICD-10-CM

## 2013-03-30 DIAGNOSIS — I4510 Unspecified right bundle-branch block: Secondary | ICD-10-CM

## 2013-03-30 DIAGNOSIS — I10 Essential (primary) hypertension: Secondary | ICD-10-CM

## 2013-03-30 DIAGNOSIS — I5189 Other ill-defined heart diseases: Secondary | ICD-10-CM

## 2013-03-30 DIAGNOSIS — Z7901 Long term (current) use of anticoagulants: Secondary | ICD-10-CM

## 2013-03-30 LAB — POCT INR: INR: 2.7

## 2013-03-30 MED ORDER — LOSARTAN POTASSIUM 100 MG PO TABS: ORAL_TABLET | ORAL | Status: DC

## 2013-03-30 MED ORDER — AMLODIPINE BESYLATE 5 MG PO TABS: 5.0000 mg | ORAL_TABLET | Freq: Every day | ORAL | Status: DC

## 2013-03-30 NOTE — Patient Instructions (Signed)
Your physician recommends that you schedule a follow-up appointment in: 6 months. No changes were made today. 

## 2013-04-06 ENCOUNTER — Ambulatory Visit: Payer: Medicare Other | Admitting: Pharmacist Clinician (PhC)/ Clinical Pharmacy Specialist

## 2013-04-07 ENCOUNTER — Encounter: Payer: Self-pay | Admitting: Cardiovascular Disease

## 2013-04-07 NOTE — Progress Notes (Signed)
Patient ID: Alex [name], male   DOB: 18-Sep-1920, 80 y.o.   MRN: [id number]      HPI: Alex [name] is a 80 y.o. male  presents to the office for six-month cardiology evaluation.  Alex [name] has a history of permanent atrial fibrillation for which he is on Coumadin anticoagulation,a history of hypertension with diastolic dysfunction and concentric left ventricular hypertrophy, as well as a history of hyperlipidemia and type 2 diabetes mellitus. He has a history of ACE-induced cough. He also has a history of pleural effusion and previously underwent that procedure which was negative for malignancy.  He tells me since I last saw him he did have 2 episodes of gout. When I saw him last  March his heart rate was in the low 50s and at that time I reduced his Toprol dose from 50 mg to 37.5 mg but apparently he has been taking this at 25 mg daily. He tells me his blood pressure at home typically runs in the 140 range systolically. He remains active. He denies chest pain. He denies shortness of breath.   Since I last saw him in September, he has continued to do fairly well. He is on Coumadin for anticoagulation and denies any significant bleeding. He denies recent leg swelling as well as he takes his Lasix. He denies episodes of chest pressure. He is unaware of palpitations. He presents for six-month evaluation  Past Medical History  Diagnosis Date  . Type II or unspecified type diabetes mellitus without mention of complication, not stated as uncontrolled   . Essential hypertension, malignant   . Abnormal chest sounds   . Edema   . CHF (congestive heart failure) 07/24/2009    echo - EF 55-65%; severe LVH; severe concentric hypertrophy; grade 1 diastolic dysfunction; aortic valve - mod regurgitation; R pleural effusion  . Pleural effusion on right   . Atrial fibrillation, chronic 10/22/2010    echo - EF >55%; impaired LV relaxation; LA mod dilated; RA mod dilated; mod mitral annular calcification; mod  tricuspid regurgitation, RVsystolic pressure ;   . RBBB 10/06/2008    Myoview - EF 65%; normal perfusion all regions, no evidence of inducible myocaridal ischemia; normal perfusion study  . Varicose vein 03/20/2010    doppler - no evidence of thrombus, thrombophlebitis, or venous insufficiency  . Diastolic dysfunction   . Left ventricular hypertrophy     concentric  . Dyslipidemia   . Pleural effusion     Past Surgical History  Procedure Laterality Date  . Hemorroidectomy    . Video assisted thoracoscopy      rt VATs , drainage of plueral effusion, pleural biopsies, talc pleurodesis    Allergies  Allergen Reactions  . Penicillins Hives    Current Outpatient Prescriptions  Medication Sig Dispense Refill  . allopurinol (ZYLOPRIM) 100 MG tablet Take 100 mg by mouth daily.       Marland Kitchen amLODipine (NORVASC) 5 MG tablet Take 1 tablet (5 mg total) by mouth daily.  90 tablet  3  . atorvastatin (LIPITOR) 20 MG tablet Take one tablet by mouth every day  as directed  90 tablet  1  . fluticasone (FLONASE) 50 MCG/ACT nasal spray Place 2 sprays into the nose as needed for rhinitis.      . furosemide (LASIX) 40 MG tablet Take one tablet by mouth twice daily  60 tablet  7  . glipiZIDE (GLUCOTROL) 10 MG 24 hr tablet Take 10 mg by mouth daily.       Marland Kitchen  losartan (COZAAR) 100 MG tablet TAKE ONE TABLET BY MOUTH ONE TIME DAILY  90 tablet  3  . metFORMIN (GLUCOPHAGE-XR) 500 MG 24 hr tablet Take 1 tablet by mouth 1 day or 1 dose.       . metoprolol succinate (TOPROL-XL) 25 MG 24 hr tablet Take 25 mg by mouth daily.      Marland Kitchen. warfarin (COUMADIN) 2.5 MG tablet Take 1 tablet by mouth daily or as directed  90 tablet  1   No current facility-administered medications for this visit.    History   Social History  . Marital Status: Married    Spouse Name: N/A    Number of Children: N/A  . Years of Education: N/A   Occupational History  . retired    Social History Main Topics  . Smoking status: Never  Smoker   . Smokeless tobacco: Never Used  . Alcohol Use: Yes     Comment: Occasional  . Drug Use: No  . Sexual Activity: Not on file   Other Topics Concern  . Not on file   Social History Narrative  . No narrative on file    History reviewed. No pertinent family history.  ROS is negative for fevers, chills or night sweats. He denies change in vision or hearing. He is unaware of significant weight change. He appears younger than stated age. He denies wheezing. He denies tachycardia or palpitations. He denies presyncope or syncope. There is no chest pressure. He denies nausea vomiting or diarrhea. His blood in stool or urine. There is no claudication. Heat intolerance. He denies any significant sleep disturbance. He denies bleeding. He denies significant claudication symptoms. He denies paresthesias.  Other comprehensive 14 point system review is negative.  PE BP 120/72  Pulse 63  Ht 5\' 10"  (1.778 m)  Wt 240 lb 6.4 oz (109.045 kg)  BMI 34.49 kg/m2  Repeat blood pressure was 130/86 General: Alert, oriented, no distress. He appears much other than his stated age of 80. He was returning 80 years old in December Skin: normal turgor, no rashes HEENT: Normocephalic, atraumatic. Pupils round and reactive; sclera anicteric;no lid lag.  Nose without nasal septal hypertrophy Mouth/Parynx benign; Mallinpatti scale 2 Neck: No JVD, no carotid bruits with normal carotid upstroke Lungs: clear to ausculatation and percussion; no wheezing or rales Heart: RRR, s1 s2 normal 1/6 systolic murmur. No diastolic murmur. No rub. Abdomen: soft, nontender; no hepatosplenomehaly, BS+; abdominal aorta nontender and not dilated by palpation. Back: No CVA tenderness Pulses 2+ Extremities: no clubbing cyanosis or edema, Homan's sign negative  Neurologic: grossly nonfocal Psychological: Normal affect and mood   ECG (independently read by me): Atrial fibrillation at 63 beats per minute. Right bundle-branch block  with repolarization changes.  Prior ECG: Atrial fibrillation with a ventricular response now in the mid 60s. Right bundle branch block with repolarization changes, unchanged.  LABS:  BMET    Component Value Date/Time   NA 138 07/25/2010 1147   K 3.5 07/25/2010 1147   CL 99 07/25/2010 1147   CO2 28 07/25/2010 1147   GLUCOSE 221* 07/25/2010 1147   BUN 16 07/25/2010 1147   CREATININE 0.90 07/25/2010 1147   CALCIUM 8.7 07/25/2010 1147   GFRNONAA >60 07/25/2010 1147   GFRAA >60 07/25/2010 1147     Hepatic Function Panel     Component Value Date/Time   PROT 6.5 06/23/2012 1019   ALBUMIN 4.1 06/23/2012 1019   AST 18 06/23/2012 1019   ALT 16 06/23/2012 1019  ALKPHOS 85 06/23/2012 1019   BILITOT 1.1 06/23/2012 1019   BILIDIR 0.2 06/23/2012 1019   IBILI 0.9 06/23/2012 1019     CBC    Component Value Date/Time   WBC 10.1 07/25/2010 1147   RBC 4.35 07/25/2010 1147   HGB 12.6* 07/25/2010 1147   HCT 37.9* 07/25/2010 1147   PLT 394 07/25/2010 1147   MCV 87.1 07/25/2010 1147   MCH 29.0 07/25/2010 1147   MCHC 33.2 07/25/2010 1147   RDW 15.1 07/25/2010 1147   LYMPHSABS 1.3 07/25/2010 1147   MONOABS 0.9 07/25/2010 1147   EOSABS 0.1 07/25/2010 1147   BASOSABS 0.1 07/25/2010 1147     BNP    Component Value Date/Time   PROBNP 3224.0* 07/11/2010 0400    Lipid Panel     Component Value Date/Time   CHOL 164 06/23/2012 1019   TRIG 116 06/23/2012 1019   HDL 56 06/23/2012 1019   CHOLHDL 2.9 06/23/2012 1019   VLDL 23 06/23/2012 1019   LDLCALC 85 06/23/2012 1019     RADIOLOGY: No results found.    ASSESSMENT AND PLAN: From a cardiac standpoint, Mr. Correa continues to be fairly stable. He does have permanent atrial fibrillation and is on chronic Coumadin therapy. His ventricular rate today is well controlled. He does have right bundle branch block with repolarization changes. He's not having any bleeding issues related to his Coumadin. INR today is therapeutic at 2.7. His blood pressure today is well  controlled at 120/70. His resting pulse is now in the mid 60s and he seemed to tolerate this better on his reduced dose of Toprol. He continues to be active.  He is on Lipitor for his hyperlipidemia.I will try to obtain results of laboratory which was done by Dr. Alysia Penna. As long as he remains stable, I will see him in 6 months for cardiology reevaluation.   Time spent: 25 minutes.  Lennette Bihari, MD, [facility] [address]  04/07/2013 5:06 PM

## 2013-05-11 ENCOUNTER — Ambulatory Visit (INDEPENDENT_AMBULATORY_CARE_PROVIDER_SITE_OTHER): Payer: Medicare Other | Admitting: Pharmacist Clinician (PhC)/ Clinical Pharmacy Specialist

## 2013-05-11 DIAGNOSIS — Z7901 Long term (current) use of anticoagulants: Secondary | ICD-10-CM

## 2013-05-11 DIAGNOSIS — I4891 Unspecified atrial fibrillation: Secondary | ICD-10-CM

## 2013-05-11 LAB — POCT INR: INR: 2.3

## 2013-06-22 ENCOUNTER — Ambulatory Visit (INDEPENDENT_AMBULATORY_CARE_PROVIDER_SITE_OTHER): Payer: Medicare Other | Admitting: Pharmacist Clinician (PhC)/ Clinical Pharmacy Specialist

## 2013-06-22 DIAGNOSIS — I4891 Unspecified atrial fibrillation: Secondary | ICD-10-CM

## 2013-06-22 DIAGNOSIS — Z7901 Long term (current) use of anticoagulants: Secondary | ICD-10-CM

## 2013-06-22 LAB — POCT INR: INR: 3.1

## 2013-07-04 ENCOUNTER — Other Ambulatory Visit: Payer: Self-pay | Admitting: Cardiovascular Disease

## 2013-07-05 ENCOUNTER — Other Ambulatory Visit: Payer: Self-pay | Admitting: *Deleted

## 2013-07-05 MED ORDER — METOPROLOL SUCCINATE ER 25 MG PO TB24: 25.0000 mg | ORAL_TABLET | Freq: Every day | ORAL | Status: DC

## 2013-07-05 NOTE — Telephone Encounter (Signed)
Rx was sent to pharmacy electronically. 

## 2013-07-05 NOTE — Telephone Encounter (Signed)
Rx refill sent to patient pharmacy   

## 2013-08-03 ENCOUNTER — Ambulatory Visit (INDEPENDENT_AMBULATORY_CARE_PROVIDER_SITE_OTHER): Payer: Medicare Other | Admitting: Pharmacist Clinician (PhC)/ Clinical Pharmacy Specialist

## 2013-08-03 DIAGNOSIS — I4891 Unspecified atrial fibrillation: Secondary | ICD-10-CM

## 2013-08-03 DIAGNOSIS — Z7901 Long term (current) use of anticoagulants: Secondary | ICD-10-CM

## 2013-08-03 LAB — POCT INR: INR: 2.9

## 2013-09-13 ENCOUNTER — Ambulatory Visit: Payer: Medicare Other | Admitting: Pharmacist Clinician (PhC)/ Clinical Pharmacy Specialist

## 2013-09-14 ENCOUNTER — Ambulatory Visit (INDEPENDENT_AMBULATORY_CARE_PROVIDER_SITE_OTHER): Payer: Medicare Other | Admitting: Pharmacist Clinician (PhC)/ Clinical Pharmacy Specialist

## 2013-09-14 DIAGNOSIS — I4891 Unspecified atrial fibrillation: Secondary | ICD-10-CM

## 2013-09-14 DIAGNOSIS — Z7901 Long term (current) use of anticoagulants: Secondary | ICD-10-CM

## 2013-09-14 LAB — POCT INR: INR: 2.6

## 2013-09-20 ENCOUNTER — Other Ambulatory Visit: Payer: Self-pay | Admitting: Cardiovascular Disease

## 2013-09-20 NOTE — Telephone Encounter (Signed)
Rx was sent to pharmacy electronically. 

## 2013-10-10 ENCOUNTER — Other Ambulatory Visit: Payer: Self-pay | Admitting: Cardiovascular Disease

## 2013-10-10 NOTE — Telephone Encounter (Signed)
Rx was sent to pharmacy electronically. 

## 2013-10-17 ENCOUNTER — Other Ambulatory Visit: Payer: Self-pay | Admitting: Pharmacist Clinician (PhC)/ Clinical Pharmacy Specialist

## 2013-11-08 ENCOUNTER — Ambulatory Visit (INDEPENDENT_AMBULATORY_CARE_PROVIDER_SITE_OTHER): Payer: Medicare Other | Admitting: *Deleted

## 2013-11-08 ENCOUNTER — Ambulatory Visit (INDEPENDENT_AMBULATORY_CARE_PROVIDER_SITE_OTHER): Payer: Medicare Other | Admitting: Cardiovascular Disease

## 2013-11-08 ENCOUNTER — Encounter: Payer: Self-pay | Admitting: Cardiovascular Disease

## 2013-11-08 VITALS — BP 130/80 | HR 64 | Ht 69.0 in | Wt 239.1 lb

## 2013-11-08 DIAGNOSIS — I4891 Unspecified atrial fibrillation: Secondary | ICD-10-CM

## 2013-11-08 DIAGNOSIS — Z7901 Long term (current) use of anticoagulants: Secondary | ICD-10-CM

## 2013-11-08 DIAGNOSIS — I4510 Unspecified right bundle-branch block: Secondary | ICD-10-CM

## 2013-11-08 DIAGNOSIS — E119 Type 2 diabetes mellitus without complications: Secondary | ICD-10-CM

## 2013-11-08 DIAGNOSIS — I480 Paroxysmal atrial fibrillation: Secondary | ICD-10-CM

## 2013-11-08 DIAGNOSIS — I10 Essential (primary) hypertension: Secondary | ICD-10-CM

## 2013-11-08 LAB — POCT INR: INR: 2.4

## 2013-11-08 NOTE — Patient Instructions (Signed)
Your physician wants you to follow-up in: 6 months or sooner if needed. You will receive a reminder letter in the mail two months in advance. If you don't receive a letter, please call our office to schedule the follow-up appointment. 

## 2013-11-08 NOTE — Progress Notes (Signed)
Patient ID: Alex [name], male   DOB: 11-May-1920, 80 y.o.   MRN: [id number]      HPI: Alex [name] is a 80 y.o. male  presents to the office for 4928-month cardiology evaluation.  Alex [name] has a history of permanent atrial fibrillation for which he is on Coumadin anticoagulation,a history of hypertension with diastolic dysfunction and concentric left ventricular hypertrophy, as well as a history of hyperlipidemia and type 2 diabetes mellitus. He has a history of ACE-induced cough. He also has a history of pleural effusion and previously underwent that procedure which was negative for malignancy.  He has had 2  episodes of gout this year.. When I saw him last  March his heart rate was in the low 50s and at that time I reduced his Toprol dose from 50 mg to 37.5 mg but apparently he has been taking this at 25 mg daily.  His resting pulse has improved and is now in the 60s.  He tells me his blood pressure at home typically runs in the 140 range systolically. He remains active. He denies chest pain. He denies shortness of breath.   He is on Coumadin anticoagulation and denies any bleeding.  He does have right bundle branch block which is chronic.  He tells me he recently saw Dr. Jarold MottoHowerda who had checked his laboratory.  [place] where he often rides a bicycle for up to 30 minutes at a time.  He denies change in exercise tolerance.  He presents for evaluation.    Past Medical History  Diagnosis Date  . Type II or unspecified type diabetes mellitus without mention of complication, not stated as uncontrolled   . Essential hypertension, malignant   . Abnormal chest sounds   . Edema   . CHF (congestive heart failure) 07/24/2009    echo - EF 55-65%; severe LVH; severe concentric hypertrophy; grade 1 diastolic dysfunction; aortic valve - mod regurgitation; R pleural effusion  . Pleural effusion on right   . Atrial fibrillation, chronic  10/22/2010    echo - EF >55%; impaired LV relaxation; LA mod dilated; RA mod dilated; mod mitral annular calcification; mod tricuspid regurgitation, RVsystolic pressure 42mmHg;   . RBBB 10/06/2008    Myoview - EF 65%; normal perfusion all regions, no evidence of inducible myocaridal ischemia; normal perfusion study  . Varicose vein 03/20/2010    doppler - no evidence of thrombus, thrombophlebitis, or venous insufficiency  . Diastolic dysfunction   . Left ventricular hypertrophy     concentric  . Dyslipidemia   . Pleural effusion     Past Surgical History  Procedure Laterality Date  . Hemorroidectomy    . Video assisted thoracoscopy      rt VATs , drainage of plueral effusion, pleural biopsies, talc pleurodesis    Allergies  Allergen Reactions  . Penicillins Hives    Current Outpatient Prescriptions  Medication Sig Dispense Refill  . allopurinol (ZYLOPRIM) 100 MG tablet Take 100 mg by mouth daily.       Marland Kitchen. amLODipine (NORVASC) 5 MG tablet Take 1 tablet (5 mg total) by mouth daily.  90 tablet  3  . atorvastatin (LIPITOR) 20 MG tablet TAKE ONE TABLET BY MOUTH EVERY DAY AS DIRECTED   90 tablet  1  . colchicine 0.6 MG tablet Take 0.6 mg by mouth 3 (three) times daily as needed.      . fluticasone (FLONASE) 50 MCG/ACT nasal  spray Place 2 sprays into the nose as needed for rhinitis.      . furosemide (LASIX) 40 MG tablet TAKE ONE TABLET BY MOUTH TWICE DAILY   60 tablet  5  . glipiZIDE (GLUCOTROL) 10 MG 24 hr tablet Take 10 mg by mouth daily.       Marland Kitchen losartan (COZAAR) 100 MG tablet TAKE ONE TABLET BY MOUTH ONE TIME DAILY  90 tablet  3  . metFORMIN (GLUCOPHAGE-XR) 500 MG 24 hr tablet Take 1 tablet by mouth 1 day or 1 dose.       . metoprolol succinate (TOPROL-XL) 25 MG 24 hr tablet Take 1 tablet (25 mg total) by mouth daily.  90 tablet  1  . warfarin (COUMADIN) 2.5 MG tablet TAKE ONE TABLET BY MOUTH DAILY OR AS DIRECTED.  90 tablet  1   No current facility-administered medications for this  visit.    History   Social History  . Marital Status: Married    Spouse Name: N/A    Number of Children: N/A  . Years of Education: N/A   Occupational History  . retired    Social History Main Topics  . Smoking status: Never Smoker   . Smokeless tobacco: Never Used  . Alcohol Use: Yes     Comment: Occasional  . Drug Use: No  . Sexual Activity: Not on file   Other Topics Concern  . Not on file   Social History Narrative  . No narrative on file    History reviewed. No pertinent family history.  ROS General: Negative; No fevers, chills, or night sweats;  HEENT: Negative; No changes in vision or hearing, sinus congestion, difficulty swallowing Pulmonary: Negative; No cough, wheezing, shortness of breath, hemoptysis Cardiovascular: Negative; No chest pain, presyncope, syncope, palpitations GI: Negative; No nausea, vomiting, diarrhea, or abdominal pain GU: Negative; No dysuria, hematuria, or difficulty voiding Musculoskeletal: Negative; no myalgias, joint pain, or weakness Hematologic/Oncology: Negative; no easy bruising, bleeding Endocrine: Negative; no heat/cold intolerance; no diabetes Neuro: Negative; no changes in balance, headaches Skin: Negative; No rashes or skin lesions Psychiatric: Negative; No behavioral problems, depression Sleep: Negative; No snoring, daytime sleepiness, hypersomnolence, bruxism, restless legs, hypnogognic hallucinations, no cataplexy Other comprehensive 14 point system review is negative.  PE BP 130/80  Pulse 64  Ht 5\' 9"  (1.753 m)  Wt 239 lb 1.6 oz (108.455 kg)  BMI 35.29 kg/m2  Repeat blood pressure was 130/86 General: Alert, oriented, no distress. He appears much younger than his stated age of 53 years. Skin: normal turgor, no rashes HEENT: Normocephalic, atraumatic. Pupils round and reactive; sclera anicteric;no lid lag.  Nose without nasal septal hypertrophy Mouth/Parynx benign; Mallinpatti scale 2 Neck: No JVD, no carotid  bruits with normal carotid upstroke Lungs: clear to ausculatation and percussion; no wheezing or rales Heart: iregularly irregular rhythm in the 60s, s1 s2 normal 1/6 systolic murmur. No diastolic murmur. No rub. Abdomen: soft, nontender; no hepatosplenomehaly, BS+; abdominal aorta nontender and not dilated by palpation. Back: No CVA tenderness Pulses 2+ Extremities: no clubbing cyanosis or edema, Homan's sign negative  Neurologic: grossly nonfocal Psychological: Normal affect and mood   CG (independently read by me) disease: Atrial fibrillation with a ventricular rate in the 60s, averaging at 64.  Right bundle branch block with repolarization changes.  QTc interval increased.   March 2015 ECG (independently read by me): Atrial fibrillation at 63 beats per minute. Right bundle-branch block with repolarization changes.  Prior ECG: Atrial fibrillation with a ventricular response  now in the mid 60s. Right bundle branch block with repolarization changes, unchanged.  LABS:  BMET    Component Value Date/Time   NA 138 07/25/2010 1147   K 3.5 07/25/2010 1147   CL 99 07/25/2010 1147   CO2 28 07/25/2010 1147   GLUCOSE 221* 07/25/2010 1147   BUN 16 07/25/2010 1147   CREATININE 0.90 07/25/2010 1147   CALCIUM 8.7 07/25/2010 1147   GFRNONAA >60 07/25/2010 1147   GFRAA >60 07/25/2010 1147     Hepatic Function Panel     Component Value Date/Time   PROT 6.5 06/23/2012 1019   ALBUMIN 4.1 06/23/2012 1019   AST 18 06/23/2012 1019   ALT 16 06/23/2012 1019   ALKPHOS 85 06/23/2012 1019   BILITOT 1.1 06/23/2012 1019   BILIDIR 0.2 06/23/2012 1019   IBILI 0.9 06/23/2012 1019     CBC    Component Value Date/Time   WBC 10.1 07/25/2010 1147   RBC 4.35 07/25/2010 1147   HGB 12.6* 07/25/2010 1147   HCT 37.9* 07/25/2010 1147   PLT 394 07/25/2010 1147   MCV 87.1 07/25/2010 1147   MCH 29.0 07/25/2010 1147   MCHC 33.2 07/25/2010 1147   RDW 15.1 07/25/2010 1147   LYMPHSABS 1.3 07/25/2010 1147   MONOABS 0.9 07/25/2010  1147   EOSABS 0.1 07/25/2010 1147   BASOSABS 0.1 07/25/2010 1147     BNP    Component Value Date/Time   PROBNP 3224.0* 07/11/2010 0400    Lipid Panel     Component Value Date/Time   CHOL 164 06/23/2012 1019   TRIG 116 06/23/2012 1019   HDL 56 06/23/2012 1019   CHOLHDL 2.9 06/23/2012 1019   VLDL 23 06/23/2012 1019   LDLCALC 85 06/23/2012 1019     RADIOLOGY: No results found.    ASSESSMENT AND PLAN:  Alex [name] is now 80 years old and has a history of permanent atrial fibrillation which is rate controlled on his current medical regimen consisting of Toprol-XL 25 mg daily.  His blood pressure today is excellent at 130/80 on losartan 100 mg,metoprolol, amlodipine 5 mg daily and furosemide 40 mg daily.  He is not having any significant peripheral edema and is now on a reduced dose of Lasix at 40 mg daily and can per to previous twice a day regimen.  He is tolerating atorvastatin 20 mg for his hyperlipidemia.  He is on metformin as well as glipizide for his type 2 diabetes mellitus.  He is on Coumadin anticoagulation and is tolerating this well with therapeutic INRs.  He tells me Dr. Jarold MottoHowerda recently checked laboratory and I will try to obtain these results.  Since I last saw him, he has done well with reference to his gout, but prior to his last office visit, he had experienced 2 episodes.  His right bundle-branch block is chronic.  He continues to exercise regularly.  He is mildly obese with a body mass index of 35.29.  As long as he continues to do well.  I will see him in 6 months for reevaluation or sooner if problems arise.  Time spent: 25 minutes.  Lennette Biharihomas A. Kelly, MD, [facility]  11/08/2013 12:28 PM

## 2013-11-10 DIAGNOSIS — E119 Type 2 diabetes mellitus without complications: Secondary | ICD-10-CM | POA: Insufficient documentation

## 2013-12-21 ENCOUNTER — Ambulatory Visit (INDEPENDENT_AMBULATORY_CARE_PROVIDER_SITE_OTHER): Payer: Medicare Other | Admitting: Pharmacist Clinician (PhC)/ Clinical Pharmacy Specialist

## 2013-12-21 DIAGNOSIS — Z7901 Long term (current) use of anticoagulants: Secondary | ICD-10-CM

## 2013-12-21 LAB — POCT INR: INR: 2.2

## 2014-02-01 ENCOUNTER — Ambulatory Visit (INDEPENDENT_AMBULATORY_CARE_PROVIDER_SITE_OTHER): Payer: Medicare Other | Admitting: Pharmacist Clinician (PhC)/ Clinical Pharmacy Specialist

## 2014-02-01 DIAGNOSIS — Z7901 Long term (current) use of anticoagulants: Secondary | ICD-10-CM

## 2014-02-01 DIAGNOSIS — I4891 Unspecified atrial fibrillation: Secondary | ICD-10-CM

## 2014-02-01 LAB — POCT INR: INR: 2.8

## 2014-03-15 ENCOUNTER — Ambulatory Visit (INDEPENDENT_AMBULATORY_CARE_PROVIDER_SITE_OTHER): Payer: Medicare Other | Admitting: Pharmacist Clinician (PhC)/ Clinical Pharmacy Specialist

## 2014-03-15 DIAGNOSIS — Z7901 Long term (current) use of anticoagulants: Secondary | ICD-10-CM

## 2014-03-15 DIAGNOSIS — I4891 Unspecified atrial fibrillation: Secondary | ICD-10-CM

## 2014-03-15 LAB — POCT INR: INR: 3.3

## 2014-03-28 ENCOUNTER — Other Ambulatory Visit: Payer: Self-pay | Admitting: Cardiovascular Disease

## 2014-03-28 NOTE — Telephone Encounter (Signed)
Rx(s) sent to pharmacy electronically.  

## 2014-04-05 ENCOUNTER — Telehealth: Payer: Self-pay | Admitting: Pharmacist Clinician (PhC)/ Clinical Pharmacy Specialist

## 2014-04-05 NOTE — Telephone Encounter (Signed)
Pt was started on doxycycline 100 mg bid this am.  Wanted to know if okay to take.  Will have him continue, repeat INR on Friday.

## 2014-04-07 ENCOUNTER — Ambulatory Visit (INDEPENDENT_AMBULATORY_CARE_PROVIDER_SITE_OTHER): Payer: Medicare Other | Admitting: Pharmacist Clinician (PhC)/ Clinical Pharmacy Specialist

## 2014-04-07 DIAGNOSIS — I4891 Unspecified atrial fibrillation: Secondary | ICD-10-CM

## 2014-04-07 DIAGNOSIS — Z7901 Long term (current) use of anticoagulants: Secondary | ICD-10-CM

## 2014-04-07 LAB — POCT INR: INR: 2.9

## 2014-04-17 ENCOUNTER — Other Ambulatory Visit: Payer: Self-pay | Admitting: Cardiovascular Disease

## 2014-04-19 ENCOUNTER — Ambulatory Visit: Payer: Medicare Other | Admitting: Pharmacist Clinician (PhC)/ Clinical Pharmacy Specialist

## 2014-04-21 ENCOUNTER — Ambulatory Visit: Payer: Medicare Other | Admitting: Pharmacist Clinician (PhC)/ Clinical Pharmacy Specialist

## 2014-04-28 ENCOUNTER — Ambulatory Visit (INDEPENDENT_AMBULATORY_CARE_PROVIDER_SITE_OTHER): Payer: Medicare Other | Admitting: Pharmacist Clinician (PhC)/ Clinical Pharmacy Specialist

## 2014-04-28 DIAGNOSIS — Z7901 Long term (current) use of anticoagulants: Secondary | ICD-10-CM

## 2014-04-28 DIAGNOSIS — I4891 Unspecified atrial fibrillation: Secondary | ICD-10-CM | POA: Diagnosis not present

## 2014-04-28 LAB — POCT INR: INR: 2.8

## 2014-05-01 ENCOUNTER — Other Ambulatory Visit: Payer: Self-pay | Admitting: Cardiovascular Disease

## 2014-05-15 ENCOUNTER — Encounter: Payer: Self-pay | Admitting: Cardiovascular Disease

## 2014-05-15 ENCOUNTER — Ambulatory Visit (INDEPENDENT_AMBULATORY_CARE_PROVIDER_SITE_OTHER): Payer: Medicare Other | Admitting: Cardiovascular Disease

## 2014-05-15 VITALS — BP 140/76 | HR 77 | Ht 69.0 in | Wt 242.0 lb

## 2014-05-15 DIAGNOSIS — I480 Paroxysmal atrial fibrillation: Secondary | ICD-10-CM

## 2014-05-15 DIAGNOSIS — E119 Type 2 diabetes mellitus without complications: Secondary | ICD-10-CM

## 2014-05-15 DIAGNOSIS — Z7901 Long term (current) use of anticoagulants: Secondary | ICD-10-CM | POA: Diagnosis not present

## 2014-05-15 DIAGNOSIS — I4510 Unspecified right bundle-branch block: Secondary | ICD-10-CM | POA: Diagnosis not present

## 2014-05-15 DIAGNOSIS — I10 Essential (primary) hypertension: Secondary | ICD-10-CM | POA: Diagnosis not present

## 2014-05-15 DIAGNOSIS — I482 Chronic atrial fibrillation, unspecified: Secondary | ICD-10-CM

## 2014-05-15 NOTE — Patient Instructions (Signed)
Your physician wants you to follow-up in: 6 months or sooner if needed. You will receive a reminder letter in the mail two months in advance. If you don't receive a letter, please call our office to schedule the follow-up appointment.  Get some support stockings over the counter and wear.

## 2014-05-16 ENCOUNTER — Encounter: Payer: Self-pay | Admitting: Cardiovascular Disease

## 2014-05-16 DIAGNOSIS — I482 Chronic atrial fibrillation, unspecified: Secondary | ICD-10-CM | POA: Insufficient documentation

## 2014-05-16 NOTE — Progress Notes (Signed)
Patient ID: Alex [name], male   DOB: Jun 11, 1920, 81 y.o.   MRN: [id number]      HPI: Alex [name] is a 81 y.o. male  presents to the office for 26-month cardiology evaluation.  Alex [name] has a history of permanent atrial fibrillation and  is on Coumadin anticoagulation,a history of hypertension with diastolic dysfunction and concentric left ventricular hypertrophy, as well as a history of hyperlipidemia and type 2 diabetes mellitus. He has a history of ACE-induced cough. He also has a history of pleural effusion and previously underwent that procedure which was negative for malignancy.  He has a history of gout and had to gout episodes last year for which she is on allopurinol and colchicine.  Due to bradycardia in the past.  His beta blocker dose has been reduced to Toprol 25 mg daily.  He continues to be on losartan 100 mg daily,Lasix 40 mg daily, and amlodipine 5 mg for blood pressure control.  Is diabetic on Glucotrol and metformin.  He is on Coumadin anticoagulation and denies any bleeding.  He does have right bundle branch block which is chronic.  He tells me he recently saw Alex [name] who had checked his laboratory.  He remains active and goes to the [facility] 5 days per week where he  rides a stationary bicycle for up to 30 minutes at a time.  He denies change in exercise tolerance.  He presents for evaluation.    Past Medical History  Diagnosis Date  . Type II or unspecified type diabetes mellitus without mention of complication, not stated as uncontrolled   . Essential hypertension, malignant   . Abnormal chest sounds   . Edema   . CHF (congestive heart failure) 07/24/2009    echo - EF 55-65%; severe LVH; severe concentric hypertrophy; grade 1 diastolic dysfunction; aortic valve - mod regurgitation; R pleural effusion  . Pleural effusion on right   . Atrial fibrillation, chronic 10/22/2010    echo - EF >55%; impaired LV relaxation; LA mod dilated; RA mod dilated; mod mitral annular  calcification; mod tricuspid regurgitation, RVsystolic pressure 29UTML;   . RBBB 10/06/2008    Myoview - EF 65%; normal perfusion all regions, no evidence of inducible myocaridal ischemia; normal perfusion study  . Varicose vein 03/20/2010    doppler - no evidence of thrombus, thrombophlebitis, or venous insufficiency  . Diastolic dysfunction   . Left ventricular hypertrophy     concentric  . Dyslipidemia   . Pleural effusion     Past Surgical History  Procedure Laterality Date  . Hemorroidectomy    . Video assisted thoracoscopy      rt VATs , drainage of plueral effusion, pleural biopsies, talc pleurodesis    Allergies  Allergen Reactions  . Penicillins Hives    Current Outpatient Prescriptions  Medication Sig Dispense Refill  . allopurinol (ZYLOPRIM) 100 MG tablet Take 100 mg by mouth daily.     Marland Kitchen amLODipine (NORVASC) 5 MG tablet TAKE ONE TABLET BY MOUTH ONE TIME DAILY 90 tablet 1  . atorvastatin (LIPITOR) 20 MG tablet TAKE ONE TABLET BY MOUTH ONE TIME DAILY  90 tablet 1  . colchicine 0.6 MG tablet Take 0.6 mg by mouth 3 (three) times daily as needed.    . fluticasone (FLONASE) 50 MCG/ACT nasal spray Place 2 sprays into the nose as needed for rhinitis.    . furosemide (LASIX) 40 MG tablet TAKE ONE TABLET BY MOUTH TWICE DAILY  60 tablet 5  . glipiZIDE (  GLUCOTROL) 10 MG 24 hr tablet Take 10 mg by mouth daily.     Marland Kitchen losartan (COZAAR) 100 MG tablet TAKE ONE TABLET BY MOUTH ONE TIME DAILY  90 tablet 0  . metFORMIN (GLUCOPHAGE-XR) 500 MG 24 hr tablet Take 1 tablet by mouth 1 day or 1 dose.     . metoprolol succinate (TOPROL-XL) 25 MG 24 hr tablet Take 1 tablet (25 mg total) by mouth daily. 90 tablet 1  . warfarin (COUMADIN) 2.5 MG tablet TAKE ONE TABLET BY MOUTH DAILY OR AS DIRECTED. 90 tablet 1   No current facility-administered medications for this visit.    History   Social History  . Marital Status: Married    Spouse Name: N/A  . Number of Children: N/A  . Years of  Education: N/A   Occupational History  . retired    Social History Main Topics  . Smoking status: Never Smoker   . Smokeless tobacco: Never Used  . Alcohol Use: 0.0 oz/week    0 Standard drinks or equivalent per week     Comment: Occasional  . Drug Use: No  . Sexual Activity: Not on file   Other Topics Concern  . Not on file   Social History Narrative    History reviewed. No pertinent family history.  ROS General: Negative; No fevers, chills, or night sweats;  HEENT: Negative; No changes in vision or hearing, sinus congestion, difficulty swallowing Pulmonary: Negative; No cough, wheezing, shortness of breath, hemoptysis Cardiovascular: Negative; No chest pain, presyncope, syncope, palpitations GI: Negative; No nausea, vomiting, diarrhea, or abdominal pain GU: Negative; No dysuria, hematuria, or difficulty voiding Musculoskeletal: Positive for breaking his left pinky. Hematologic/Oncology: Negative; no easy bruising, bleeding Endocrine: Negative; no heat/cold intolerance; no diabetes Neuro: Negative; no changes in balance, headaches Skin: Negative; No rashes or skin lesions Psychiatric: Negative; No behavioral problems, depression Sleep: Negative; No snoring, daytime sleepiness, hypersomnolence, bruxism, restless legs, hypnogognic hallucinations, no cataplexy Other comprehensive 14 point system review is negative.  PE BP 140/76 mmHg  Pulse 77  Ht $R'5\' 9"'Uu$  (1.753 m)  Wt 242 lb (109.77 kg)  BMI 35.72 kg/m2  Repeat blood pressure was 122/70 General: Alert, oriented, no distress. He appears much younger than his stated age of 74 years. Skin: normal turgor, no rashes HEENT: Normocephalic, atraumatic. Pupils round and reactive; sclera anicteric;no lid lag.  Nose without nasal septal hypertrophy Mouth/Parynx benign; Mallinpatti scale 2 Neck: No JVD, no carotid bruits with normal carotid upstroke Lungs: clear to ausculatation and percussion; no wheezing or rales Heart:  iregularly irregular rhythm in the 60s, s1 s2 normal 1/6 systolic murmur. No diastolic murmur. No rub. Abdomen: soft, nontender; no hepatosplenomehaly, BS+; abdominal aorta nontender and not dilated by palpation. Back: No CVA tenderness Pulses 2+ Extremities: Trace edema at the ankles.  no clubbing cyanosis , Homan's sign negative; he had recently broken his left pinky which is improved. Neurologic: grossly nonfocal Psychological: Normal affect and mood   ECG (independently read by me): Atrial fibrillation with a ventricular rate in the 70s.  Right bundle branch block with repolarization changes.  Prior October 2015 ECG (independently read by me) disease: Atrial fibrillation with a ventricular rate in the 60s, averaging at 64.  Right bundle branch block with repolarization changes.  QTc interval increased.   March 2015 ECG (independently read by me): Atrial fibrillation at 63 beats per minute. Right bundle-branch block with repolarization changes.  Prior ECG: Atrial fibrillation with a ventricular response now in the mid 60s.  Right bundle branch block with repolarization changes, unchanged.  LABS:  BMP Latest Ref Rng 07/25/2010 07/11/2010 07/10/2010  Glucose 70 - 99 mg/dL 221(H) 135(H) 134(H)  BUN 6 - 23 mg/dL $Remove'16 17 18  'LfkMcJU$ Creatinine 0.50 - 1.35 mg/dL 0.90 0.99 1.02  Sodium 135 - 145 mEq/L 138 133(L) 134(L)  Potassium 3.5 - 5.1 mEq/L 3.5 4.1 4.1  Chloride 96 - 112 mEq/L 99 100 101  CO2 19 - 32 mEq/L $Remove'28 26 27  'vVBnYnQ$ Calcium 8.4 - 10.5 mg/dL 8.7 7.6(L) 8.1(L)   Hepatic Function Latest Ref Rng 06/23/2012 07/03/2010 07/25/2009  Total Protein 6.0 - 8.3 g/dL 6.5 6.0 6.0  Albumin 3.5 - 5.2 g/dL 4.1 2.7(L) 3.3(L)  AST 0 - 37 U/L $Remo'18 15 24  'vkLPY$ ALT 0 - 53 U/L $Remo'16 9 14  'qTeYg$ Alk Phosphatase 39 - 117 U/L 85 108 69  Total Bilirubin 0.3 - 1.2 mg/dL 1.1 0.5 0.6  Bilirubin, Direct 0.0 - 0.3 mg/dL 0.2 - 0.3   CBC Latest Ref Rng 07/25/2010 07/11/2010 07/10/2010  WBC 4.0 - 10.5 K/uL 10.1 10.0 10.4  Hemoglobin 13.0 - 17.0  g/dL 12.6(L) 10.2(L) 10.4(L)  Hematocrit 39.0 - 52.0 % 37.9(L) 30.8(L) 31.4(L)  Platelets 150 - 400 K/uL 394 171 155   No results found for: TSH  Lipid Panel     Component Value Date/Time   CHOL 164 06/23/2012 1019   TRIG 116 06/23/2012 1019   HDL 56 06/23/2012 1019   CHOLHDL 2.9 06/23/2012 1019   VLDL 23 06/23/2012 1019   LDLCALC 85 06/23/2012 1019    RADIOLOGY: No results found.    ASSESSMENT AND PLAN:  Mr. Cruise is now 81 years old and has a history of permanent atrial fibrillation which is rate controlled on his current medical regimen consisting of Toprol-XL 25 mg daily.  His blood pressure today is controlled on  losartan 100 mg,metoprolol, amlodipine 5 mg daily and furosemide 40 mg daily.  He is not having any significant peripheral edema and is now on a reduced dose of Lasix at 40 mg daily. He is tolerating atorvastatin 20 mg for his hyperlipidemia.  He is on metformin as well as glipizide for his type 2 diabetes mellitus.  He is on Coumadin anticoagulation and is tolerating this well with therapeutic INRs.  He sees Alex [name] at [facility] who checks his laboratory and I will try to obtain these results.  His right bundle-branch block is chronic.  He's not had any additional gout episodes since his last office visit.  Suggested support stockings to help with his ankle edema.  I will see him in 6 months for reevaluation or sooner if problems arise.  Time spent: 25 minutes.  Troy Sine, MD, [facility]  05/16/2014 8:05 AM

## 2014-05-24 ENCOUNTER — Other Ambulatory Visit: Payer: Self-pay | Admitting: Cardiovascular Disease

## 2014-05-26 ENCOUNTER — Ambulatory Visit (INDEPENDENT_AMBULATORY_CARE_PROVIDER_SITE_OTHER): Payer: Medicare Other | Admitting: Pharmacist Clinician (PhC)/ Clinical Pharmacy Specialist

## 2014-05-26 DIAGNOSIS — Z7901 Long term (current) use of anticoagulants: Secondary | ICD-10-CM | POA: Diagnosis not present

## 2014-05-26 DIAGNOSIS — I4891 Unspecified atrial fibrillation: Secondary | ICD-10-CM

## 2014-05-26 LAB — POCT INR: INR: 3.2

## 2014-06-23 ENCOUNTER — Ambulatory Visit (INDEPENDENT_AMBULATORY_CARE_PROVIDER_SITE_OTHER): Payer: Medicare Other | Admitting: Pharmacist Clinician (PhC)/ Clinical Pharmacy Specialist

## 2014-06-23 DIAGNOSIS — I4891 Unspecified atrial fibrillation: Secondary | ICD-10-CM

## 2014-06-23 DIAGNOSIS — Z7901 Long term (current) use of anticoagulants: Secondary | ICD-10-CM

## 2014-06-23 LAB — POCT INR: INR: 3.3

## 2014-07-10 ENCOUNTER — Other Ambulatory Visit: Payer: Self-pay | Admitting: Cardiovascular Disease

## 2014-07-14 ENCOUNTER — Ambulatory Visit (INDEPENDENT_AMBULATORY_CARE_PROVIDER_SITE_OTHER): Payer: Medicare Other | Admitting: Pharmacist Clinician (PhC)/ Clinical Pharmacy Specialist

## 2014-07-14 DIAGNOSIS — I4891 Unspecified atrial fibrillation: Secondary | ICD-10-CM | POA: Diagnosis not present

## 2014-07-14 DIAGNOSIS — Z7901 Long term (current) use of anticoagulants: Secondary | ICD-10-CM | POA: Diagnosis not present

## 2014-07-14 LAB — POCT INR: INR: 1.9

## 2014-08-03 ENCOUNTER — Ambulatory Visit (INDEPENDENT_AMBULATORY_CARE_PROVIDER_SITE_OTHER): Payer: Medicare Other | Admitting: Pharmacist Clinician (PhC)/ Clinical Pharmacy Specialist

## 2014-08-03 DIAGNOSIS — I4891 Unspecified atrial fibrillation: Secondary | ICD-10-CM | POA: Diagnosis not present

## 2014-08-03 DIAGNOSIS — Z7901 Long term (current) use of anticoagulants: Secondary | ICD-10-CM | POA: Diagnosis not present

## 2014-08-03 LAB — POCT INR: INR: 2.1

## 2014-08-04 ENCOUNTER — Ambulatory Visit: Payer: Medicare Other | Admitting: Pharmacist Clinician (PhC)/ Clinical Pharmacy Specialist

## 2014-09-01 ENCOUNTER — Ambulatory Visit (INDEPENDENT_AMBULATORY_CARE_PROVIDER_SITE_OTHER): Payer: Medicare Other | Admitting: Pharmacist Clinician (PhC)/ Clinical Pharmacy Specialist

## 2014-09-01 DIAGNOSIS — Z7901 Long term (current) use of anticoagulants: Secondary | ICD-10-CM | POA: Diagnosis not present

## 2014-09-01 DIAGNOSIS — I4891 Unspecified atrial fibrillation: Secondary | ICD-10-CM

## 2014-09-01 LAB — POCT INR: INR: 1.7

## 2014-09-11 ENCOUNTER — Other Ambulatory Visit: Payer: Self-pay | Admitting: Cardiovascular Disease

## 2014-09-11 NOTE — Telephone Encounter (Signed)
REFILL 

## 2014-09-20 ENCOUNTER — Other Ambulatory Visit: Payer: Self-pay | Admitting: Cardiovascular Disease

## 2014-09-20 NOTE — Telephone Encounter (Signed)
Rx request sent to pharmacy.  

## 2014-09-22 ENCOUNTER — Ambulatory Visit (INDEPENDENT_AMBULATORY_CARE_PROVIDER_SITE_OTHER): Payer: Medicare Other | Admitting: Pharmacist Clinician (PhC)/ Clinical Pharmacy Specialist

## 2014-09-22 DIAGNOSIS — I4891 Unspecified atrial fibrillation: Secondary | ICD-10-CM

## 2014-09-22 DIAGNOSIS — Z7901 Long term (current) use of anticoagulants: Secondary | ICD-10-CM

## 2014-09-22 LAB — POCT INR: INR: 2.1

## 2014-10-20 ENCOUNTER — Ambulatory Visit (INDEPENDENT_AMBULATORY_CARE_PROVIDER_SITE_OTHER): Payer: Medicare Other | Admitting: Pharmacist Clinician (PhC)/ Clinical Pharmacy Specialist

## 2014-10-20 DIAGNOSIS — Z7901 Long term (current) use of anticoagulants: Secondary | ICD-10-CM

## 2014-10-20 DIAGNOSIS — I4891 Unspecified atrial fibrillation: Secondary | ICD-10-CM | POA: Diagnosis not present

## 2014-10-20 LAB — POCT INR: INR: 3.2

## 2014-10-24 ENCOUNTER — Other Ambulatory Visit: Payer: Self-pay | Admitting: Cardiovascular Disease

## 2014-10-24 NOTE — Telephone Encounter (Signed)
Rx(s) sent to pharmacy electronically.  

## 2014-11-03 ENCOUNTER — Ambulatory Visit (INDEPENDENT_AMBULATORY_CARE_PROVIDER_SITE_OTHER): Payer: Medicare Other | Admitting: Pharmacist Clinician (PhC)/ Clinical Pharmacy Specialist

## 2014-11-03 DIAGNOSIS — I4891 Unspecified atrial fibrillation: Secondary | ICD-10-CM

## 2014-11-03 DIAGNOSIS — Z7901 Long term (current) use of anticoagulants: Secondary | ICD-10-CM

## 2014-11-03 LAB — POCT INR: INR: 2.1

## 2014-11-24 ENCOUNTER — Ambulatory Visit (INDEPENDENT_AMBULATORY_CARE_PROVIDER_SITE_OTHER): Payer: Medicare Other | Admitting: Pharmacist Clinician (PhC)/ Clinical Pharmacy Specialist

## 2014-11-24 DIAGNOSIS — I4891 Unspecified atrial fibrillation: Secondary | ICD-10-CM | POA: Diagnosis not present

## 2014-11-24 DIAGNOSIS — Z7901 Long term (current) use of anticoagulants: Secondary | ICD-10-CM | POA: Diagnosis not present

## 2014-11-24 LAB — POCT INR: INR: 2.4

## 2014-12-19 ENCOUNTER — Other Ambulatory Visit: Payer: Self-pay | Admitting: Cardiovascular Disease

## 2014-12-19 NOTE — Telephone Encounter (Signed)
Rx(s) sent to pharmacy electronically.  

## 2015-01-02 ENCOUNTER — Other Ambulatory Visit: Payer: Self-pay | Admitting: Cardiovascular Disease

## 2015-01-05 ENCOUNTER — Ambulatory Visit (INDEPENDENT_AMBULATORY_CARE_PROVIDER_SITE_OTHER): Payer: Medicare Other | Admitting: Pharmacist Clinician (PhC)/ Clinical Pharmacy Specialist

## 2015-01-05 DIAGNOSIS — I4891 Unspecified atrial fibrillation: Secondary | ICD-10-CM | POA: Diagnosis not present

## 2015-01-05 DIAGNOSIS — Z7901 Long term (current) use of anticoagulants: Secondary | ICD-10-CM

## 2015-01-05 LAB — POCT INR: INR: 2.6

## 2015-01-16 DIAGNOSIS — R946 Abnormal results of thyroid function studies: Secondary | ICD-10-CM | POA: Diagnosis not present

## 2015-01-16 DIAGNOSIS — R8290 Unspecified abnormal findings in urine: Secondary | ICD-10-CM | POA: Diagnosis not present

## 2015-01-16 DIAGNOSIS — N390 Urinary tract infection, site not specified: Secondary | ICD-10-CM | POA: Diagnosis not present

## 2015-01-16 DIAGNOSIS — E119 Type 2 diabetes mellitus without complications: Secondary | ICD-10-CM | POA: Diagnosis not present

## 2015-01-16 DIAGNOSIS — M109 Gout, unspecified: Secondary | ICD-10-CM | POA: Diagnosis not present

## 2015-01-16 DIAGNOSIS — E784 Other hyperlipidemia: Secondary | ICD-10-CM | POA: Diagnosis not present

## 2015-01-17 ENCOUNTER — Other Ambulatory Visit: Payer: Self-pay | Admitting: Cardiovascular Disease

## 2015-01-17 NOTE — Telephone Encounter (Signed)
Rx request sent to pharmacy.  

## 2015-01-18 ENCOUNTER — Telehealth: Payer: Self-pay | Admitting: Pharmacist Clinician (PhC)/ Clinical Pharmacy Specialist

## 2015-01-19 NOTE — Telephone Encounter (Signed)
Close encounter 

## 2015-01-22 ENCOUNTER — Other Ambulatory Visit: Payer: Self-pay | Admitting: Cardiovascular Disease

## 2015-01-22 NOTE — Telephone Encounter (Signed)
Rx request sent to pharmacy.  

## 2015-02-13 DIAGNOSIS — Z13820 Encounter for screening for osteoporosis: Secondary | ICD-10-CM | POA: Diagnosis not present

## 2015-02-13 DIAGNOSIS — I10 Essential (primary) hypertension: Secondary | ICD-10-CM | POA: Diagnosis not present

## 2015-02-13 DIAGNOSIS — I129 Hypertensive chronic kidney disease with stage 1 through stage 4 chronic kidney disease, or unspecified chronic kidney disease: Secondary | ICD-10-CM | POA: Diagnosis not present

## 2015-02-13 DIAGNOSIS — I509 Heart failure, unspecified: Secondary | ICD-10-CM | POA: Diagnosis not present

## 2015-02-13 DIAGNOSIS — N390 Urinary tract infection, site not specified: Secondary | ICD-10-CM | POA: Diagnosis not present

## 2015-02-13 DIAGNOSIS — E784 Other hyperlipidemia: Secondary | ICD-10-CM | POA: Diagnosis not present

## 2015-02-13 DIAGNOSIS — Z6835 Body mass index (BMI) 35.0-35.9, adult: Secondary | ICD-10-CM | POA: Diagnosis not present

## 2015-02-13 DIAGNOSIS — I480 Paroxysmal atrial fibrillation: Secondary | ICD-10-CM | POA: Diagnosis not present

## 2015-02-13 DIAGNOSIS — N182 Chronic kidney disease, stage 2 (mild): Secondary | ICD-10-CM | POA: Diagnosis not present

## 2015-02-13 DIAGNOSIS — Z23 Encounter for immunization: Secondary | ICD-10-CM | POA: Diagnosis not present

## 2015-02-13 DIAGNOSIS — Z0000 Encounter for general adult medical examination without abnormal findings: Secondary | ICD-10-CM | POA: Diagnosis not present

## 2015-02-13 DIAGNOSIS — Z1389 Encounter for screening for other disorder: Secondary | ICD-10-CM | POA: Diagnosis not present

## 2015-02-13 DIAGNOSIS — E119 Type 2 diabetes mellitus without complications: Secondary | ICD-10-CM | POA: Diagnosis not present

## 2015-02-16 ENCOUNTER — Ambulatory Visit (INDEPENDENT_AMBULATORY_CARE_PROVIDER_SITE_OTHER): Payer: PPO | Admitting: Pharmacist Clinician (PhC)/ Clinical Pharmacy Specialist

## 2015-02-16 DIAGNOSIS — I4891 Unspecified atrial fibrillation: Secondary | ICD-10-CM

## 2015-02-16 DIAGNOSIS — Z7901 Long term (current) use of anticoagulants: Secondary | ICD-10-CM

## 2015-02-16 DIAGNOSIS — I480 Paroxysmal atrial fibrillation: Secondary | ICD-10-CM | POA: Diagnosis not present

## 2015-02-16 LAB — POCT INR: INR: 2.7

## 2015-02-21 ENCOUNTER — Other Ambulatory Visit: Payer: Self-pay | Admitting: Cardiovascular Disease

## 2015-02-21 DIAGNOSIS — Z6835 Body mass index (BMI) 35.0-35.9, adult: Secondary | ICD-10-CM | POA: Diagnosis not present

## 2015-02-21 DIAGNOSIS — M109 Gout, unspecified: Secondary | ICD-10-CM | POA: Diagnosis not present

## 2015-03-10 ENCOUNTER — Other Ambulatory Visit: Payer: Self-pay | Admitting: Cardiovascular Disease

## 2015-03-12 NOTE — Telephone Encounter (Signed)
Rx(s) sent to pharmacy electronically.  

## 2015-03-30 ENCOUNTER — Ambulatory Visit (INDEPENDENT_AMBULATORY_CARE_PROVIDER_SITE_OTHER): Payer: PPO | Admitting: Pharmacist Clinician (PhC)/ Clinical Pharmacy Specialist

## 2015-03-30 DIAGNOSIS — Z7901 Long term (current) use of anticoagulants: Secondary | ICD-10-CM

## 2015-03-30 DIAGNOSIS — I4891 Unspecified atrial fibrillation: Secondary | ICD-10-CM

## 2015-03-30 LAB — POCT INR: INR: 2.5

## 2015-05-04 ENCOUNTER — Ambulatory Visit (INDEPENDENT_AMBULATORY_CARE_PROVIDER_SITE_OTHER): Payer: PPO | Admitting: Pharmacist Clinician (PhC)/ Clinical Pharmacy Specialist

## 2015-05-04 ENCOUNTER — Ambulatory Visit (INDEPENDENT_AMBULATORY_CARE_PROVIDER_SITE_OTHER): Payer: PPO | Admitting: Cardiovascular Disease

## 2015-05-04 ENCOUNTER — Encounter: Payer: Self-pay | Admitting: Cardiovascular Disease

## 2015-05-04 VITALS — BP 120/80 | HR 48 | Ht 67.5 in | Wt 242.8 lb

## 2015-05-04 DIAGNOSIS — Z7901 Long term (current) use of anticoagulants: Secondary | ICD-10-CM

## 2015-05-04 DIAGNOSIS — E785 Hyperlipidemia, unspecified: Secondary | ICD-10-CM | POA: Diagnosis not present

## 2015-05-04 DIAGNOSIS — I4510 Unspecified right bundle-branch block: Secondary | ICD-10-CM

## 2015-05-04 DIAGNOSIS — I10 Essential (primary) hypertension: Secondary | ICD-10-CM | POA: Diagnosis not present

## 2015-05-04 DIAGNOSIS — I4891 Unspecified atrial fibrillation: Secondary | ICD-10-CM | POA: Diagnosis not present

## 2015-05-04 DIAGNOSIS — I482 Chronic atrial fibrillation, unspecified: Secondary | ICD-10-CM

## 2015-05-04 DIAGNOSIS — E119 Type 2 diabetes mellitus without complications: Secondary | ICD-10-CM

## 2015-05-04 DIAGNOSIS — Z794 Long term (current) use of insulin: Secondary | ICD-10-CM

## 2015-05-04 LAB — POCT INR: INR: 2.5

## 2015-05-04 MED ORDER — METOPROLOL SUCCINATE ER 25 MG PO TB24: 12.5000 mg | ORAL_TABLET | Freq: Every day | ORAL | Status: DC

## 2015-05-04 MED ORDER — FUROSEMIDE 40 MG PO TABS: 40.0000 mg | ORAL_TABLET | Freq: Every day | ORAL | Status: AC

## 2015-05-04 NOTE — Patient Instructions (Signed)
Your physician has recommended you make the following change in your medication:   1.) the metoprolol succ 25mg  has been decreased to 1/2 tablet daily. A new prescription has been sent to your pharmacy to reflect the change.  Your physician recommends that you return for lab work.  Your physician wants you to follow-up in: 1 year or sooner if needed. You will receive a reminder letter in the mail two months in advance. If you don't receive a letter, please call our office to schedule the follow-up appointment.   If you need a refill on your cardiac medications before your next appointment, please call your pharmacy.

## 2015-05-06 ENCOUNTER — Encounter: Payer: Self-pay | Admitting: Cardiovascular Disease

## 2015-05-06 DIAGNOSIS — E785 Hyperlipidemia, unspecified: Secondary | ICD-10-CM | POA: Insufficient documentation

## 2015-05-06 NOTE — Progress Notes (Signed)
Patient ID: Alex [name], male   DOB: [birth year], 82 y.o.   MRN: [id number]     Primary M.D.: Dr. Velna Hatchet  HPI: Alex [name] is a 82 y.o. male  presents to the office for an 67monthcardiology evaluation.  Alex [name] a history of permanent atrial fibrillation and  is on Coumadin anticoagulation.  He has a history of hypertension with diastolic dysfunction and concentric left ventricular hypertrophy, as well as a history of hyperlipidemia and type 2 diabetes mellitus. He has a history of ACE-induced cough. He also has a history of pleural effusion and previously underwent that procedure which was negative for malignancy.  He has a history of gout and  is on allopurinol and colchicine.  Due to bradycardia in the past his beta blocker dose has been reduced to Toprol 25 mg daily.  He continues to be on losartan 100 mg daily,Lasix 40 mg daily, and amlodipine 5 mg for blood pressure control. He is diabetic on Glucotrol and metformin.  He is on Coumadin anticoagulation and denies any bleeding.  He has right bundle branch block which is chronic.    [facility] remains [facility] and [facility]  rides a stationary bicycle for up to 30 minutes at a time.  He denies change in exercise tolerance.  He denies any chest pressure.  He denies any episodes of tachycardia.  He denies presyncope or syncope.  He presents for evaluation.    Past Medical History  Diagnosis Date  . Type II or unspecified type diabetes mellitus without mention of complication, not stated as uncontrolled   . Essential hypertension, malignant   . Abnormal chest sounds   . Edema   . [place] 07/[place]    echo - EF 55-65%; severe LVH; severe concentric hypertrophy; grade 1 diastolic dysfunction; aortic valve - mod regurgitation; R pleural effusion  . Pleural effusion on right   . [place] 10/[place]    echo - EF >55%; impaired LV relaxation; LA mod dilated; RA mod  dilated; mod mitral annular calcification; mod tricuspid regurgitation, RVsystolic pressure 493TTSV   . RBBB 10/06/2008    Myoview - EF 65%; normal perfusion all regions, no evidence of inducible myocaridal ischemia; normal perfusion study  . Varicose vein 03/20/2010    doppler - no evidence of thrombus, thrombophlebitis, or venous insufficiency  . Diastolic dysfunction   . Left ventricular hypertrophy     concentric  . Dyslipidemia   . Pleural effusion     Past Surgical History  Procedure Laterality Date  . Hemorroidectomy    . Video assisted thoracoscopy      rt VATs , drainage of plueral effusion, pleural biopsies, talc pleurodesis    Allergies  Allergen Reactions  . Penicillins Hives    Current Outpatient Prescriptions  Medication Sig Dispense Refill  . allopurinol (ZYLOPRIM) 100 MG tablet Take 100 mg by mouth daily.     .Marland KitchenamLODipine (NORVASC) 5 MG tablet TAKE 1 TAB BY MOUTH ONCE DAILY. NEEDS APPT FOR MORE REFILLS. 30 tablet 0  . atorvastatin (LIPITOR) 20 MG tablet TAKE ONE TABLET BY MOUTH ONE TIME DAILY 90 tablet 2  . colchicine 0.6 MG tablet Take 0.6 mg by mouth 3 (three) times daily as needed.    . furosemide (LASIX) 40 MG tablet Take 1 tablet (40 mg total) by mouth daily. 60 tablet 6  . glipiZIDE (GLUCOTROL) 10 MG 24 hr tablet Take 10 mg by  mouth daily.     Marland Kitchen losartan (COZAAR) 100 MG tablet TAKE ONE TABLET BY MOUTH ONE TIME DAILY 30 tablet 0  . metFORMIN (GLUCOPHAGE-XR) 500 MG 24 hr tablet Take 1 tablet by mouth 1 day or 1 dose.     . metoprolol succinate (TOPROL-XL) 25 MG 24 hr tablet Take 0.5 tablets (12.5 mg total) by mouth daily. 45 tablet 3  . warfarin (COUMADIN) 2.5 MG tablet TAKE 1 TABLET BY MOUTH EVERY DAY OR AS DIRECTED 90 tablet 1   No current facility-administered medications for this visit.    Social History   Social History  . Marital Status: Married    Spouse Name: N/A  . Number of Children: N/A  . Years of Education: N/A   Occupational History  .  retired    Social History Main Topics  . Smoking status: Never Smoker   . Smokeless tobacco: Never Used  . Alcohol Use: 0.0 oz/week    0 Standard drinks or equivalent per week     Comment: Occasional  . Drug Use: No  . Sexual Activity: Not on file   Other Topics Concern  . Not on file   Social History Narrative    No family history on file.  ROS General: Negative; No fevers, chills, or night sweats;  HEENT: Negative; No changes in vision or hearing, sinus congestion, difficulty swallowing Pulmonary: Negative; No cough, wheezing, shortness of breath, hemoptysis Cardiovascular: See history of present illness GI: Negative; No nausea, vomiting, diarrhea, or abdominal pain GU: Negative; No dysuria, hematuria, or difficulty voiding Musculoskeletal: Positive for breaking his left pinky. Hematologic/Oncology: Negative; no easy bruising, bleeding Endocrine: Positive for diabetes mellitus Rheumatologic: Positive for gout Neuro: Negative; no changes in balance, headaches Skin: Negative; No rashes or skin lesions Psychiatric: Negative; No behavioral problems, depression Sleep: Negative; No snoring, daytime sleepiness, hypersomnolence, bruxism, restless legs, hypnogognic hallucinations, no cataplexy Other comprehensive 14 point system review is negative.  PE BP 120/80 mmHg  Pulse 48  Ht 5' 7.5" (1.715 m)  Wt 242 lb 12.8 oz (110.133 kg)  BMI 37.44 kg/m2  Repeat blood pressure was 122/78.  Wt Readings from Last 3 Encounters:  05/04/15 242 lb 12.8 oz (110.133 kg)  05/15/14 242 lb (109.77 kg)  11/08/13 239 lb 1.6 oz (108.455 kg)   General: Alert, oriented, no distress. He appears much younger than his stated age of 40 years. Skin: normal turgor, no rashes HEENT: Normocephalic, atraumatic. Pupils round and reactive; sclera anicteric;no lid lag.  Nose without nasal septal hypertrophy Mouth/Parynx benign; Mallinpatti scale 2 Neck: No JVD, no carotid bruits with normal carotid  upstroke Lungs: clear to ausculatation and percussion; no wheezing or rales Heart: iregularly irregular rhythm in the upper 40s, s1 s2 normal 1/6 systolic murmur. No diastolic murmur. No rub. Abdomen: soft, nontender; no hepatosplenomehaly, BS+; abdominal aorta nontender and not dilated by palpation. Back: No CVA tenderness Pulses 2+ Extremities: Trace edema at the ankles.  no clubbing cyanosis , Homan's sign negative; he had recently broken his left pinky which is improved. Neurologic: grossly nonfocal Psychological: Normal affect and mood   ECG (independently read by me): Atrial fibrillation with a slow ventricular response with rate averaging 48 bpm.  Right bundle-branch block.  May 2016 ECG (independently read by me): Atrial fibrillation with a ventricular rate in the 70s.  Right bundle branch block with repolarization changes.  Prior October 2015 ECG (independently read by me) disease: Atrial fibrillation with a ventricular rate in the 60s, averaging at 64.  Right bundle branch block with repolarization changes.  QTc interval increased.   March 2015 ECG (independently read by me): Atrial fibrillation at 63 beats per minute. Right bundle-branch block with repolarization changes.  Prior ECG: Atrial fibrillation with a ventricular response now in the mid 60s. Right bundle branch block with repolarization changes, unchanged.  LABS:  BMP Latest Ref Rng 07/25/2010 07/11/2010 07/10/2010  Glucose 70 - 99 mg/dL 221(H) 135(H) 134(H)  BUN 6 - 23 mg/dL '16 17 18  '$ Creatinine 0.50 - 1.35 mg/dL 0.90 0.99 1.02  Sodium 135 - 145 mEq/L 138 133(L) 134(L)  Potassium 3.5 - 5.1 mEq/L 3.5 4.1 4.1  Chloride 96 - 112 mEq/L 99 100 101  CO2 19 - 32 mEq/L '28 26 27  '$ Calcium 8.4 - 10.5 mg/dL 8.7 7.6(L) 8.1(L)   Hepatic Function Latest Ref Rng 06/23/2012 07/03/2010 07/25/2009  Total Protein 6.0 - 8.3 g/dL 6.5 6.0 6.0  Albumin 3.5 - 5.2 g/dL 4.1 2.7(L) 3.3(L)  AST 0 - 37 U/L '18 15 24  '$ ALT 0 - 53 U/L '16 9 14  '$ Alk  Phosphatase 39 - 117 U/L 85 108 69  Total Bilirubin 0.3 - 1.2 mg/dL 1.1 0.5 0.6  Bilirubin, Direct 0.0 - 0.3 mg/dL 0.2 - 0.3   CBC Latest Ref Rng 07/25/2010 07/11/2010 07/10/2010  WBC 4.0 - 10.5 K/uL 10.1 10.0 10.4  Hemoglobin 13.0 - 17.0 g/dL 12.6(L) 10.2(L) 10.4(L)  Hematocrit 39.0 - 52.0 % 37.9(L) 30.8(L) 31.4(L)  Platelets 150 - 400 K/uL 394 171 155   No results found for: TSH  Lipid Panel     Component Value Date/Time   CHOL 164 06/23/2012 1019   TRIG 116 06/23/2012 1019   HDL 56 06/23/2012 1019   CHOLHDL 2.9 06/23/2012 1019   VLDL 23 06/23/2012 1019   LDLCALC 85 06/23/2012 1019    RADIOLOGY: No results found.    ASSESSMENT AND PLAN: Mr. Mackowski is a 82 year old Caucasian male who appears younger than stated age.  He remains fairly active. He has a history of permanent atrial fibrillation and continues to be on warfarin for anticoagulation therapy.  His atrial fibrillation rate today is slow in the upper 40s and he has been on Toprol-XL at 25 mg daily.  I am reducing this to 12.5 mg.  His blood pressure today is controlled on  losartan 100 mg, Toprol, amlodipine 5 mg daily and furosemide 40 mg daily.  There is trivial ankle swelling which is not significant.  He is tolerating atorvastatin 20 mg for his hyperlipidemia.  He denies myalgias.  He has not had recent lab work.  He is on metformin as well as glipizide for his type 2 diabetes mellitus.  His INR today is therapeutic at 2.5 on his current dose of warfarin.  He has a history of gout and is on allopurinol and when necessary colchicine.  He has  not had recent episodes.  Fasting lab work will be recommended.  He will be seeing his primary physician later this year.  I will see him in one year for reevaluation or sooner if problems arise.  Time spent: 25 minutes.  Troy Sine, MD, [facility]  05/06/2015 12:09 PM

## 2015-05-09 DIAGNOSIS — I4891 Unspecified atrial fibrillation: Secondary | ICD-10-CM | POA: Diagnosis not present

## 2015-05-09 DIAGNOSIS — E785 Hyperlipidemia, unspecified: Secondary | ICD-10-CM | POA: Diagnosis not present

## 2015-05-09 DIAGNOSIS — Z794 Long term (current) use of insulin: Secondary | ICD-10-CM | POA: Diagnosis not present

## 2015-05-09 DIAGNOSIS — I10 Essential (primary) hypertension: Secondary | ICD-10-CM | POA: Diagnosis not present

## 2015-05-09 DIAGNOSIS — E119 Type 2 diabetes mellitus without complications: Secondary | ICD-10-CM | POA: Diagnosis not present

## 2015-05-09 DIAGNOSIS — I482 Chronic atrial fibrillation: Secondary | ICD-10-CM | POA: Diagnosis not present

## 2015-05-09 LAB — HEMOGLOBIN A1C
Hgb A1c MFr Bld: 5.9 % — ABNORMAL HIGH (ref <5.7)
Mean Plasma Glucose: 123 mg/dL

## 2015-05-09 LAB — CBC
HEMATOCRIT: 42.8 % (ref 38.5–50.0)
HEMOGLOBIN: 14.3 g/dL (ref 13.2–17.1)
MCH: 30.4 pg (ref 27.0–33.0)
MCHC: 33.4 g/dL (ref 32.0–36.0)
MCV: 91.1 fL (ref 80.0–100.0)
MPV: 10.4 fL (ref 7.5–12.5)
Platelets: 207 10*3/uL (ref 140–400)
RBC: 4.7 MIL/uL (ref 4.20–5.80)
RDW: 14.8 % (ref 11.0–15.0)
WBC: 7.6 10*3/uL (ref 3.8–10.8)

## 2015-05-10 LAB — LIPID PANEL
CHOLESTEROL: 129 mg/dL (ref 125–200)
HDL: 57 mg/dL (ref >=40)
LDL CALC: 59 mg/dL (ref <130)
TRIGLYCERIDES: 64 mg/dL (ref <150)
Total CHOL/HDL Ratio: 2.3 Ratio (ref <=5.0)
VLDL: 13 mg/dL (ref <30)

## 2015-05-10 LAB — COMPREHENSIVE METABOLIC PANEL
ALBUMIN: 3.7 g/dL (ref 3.6–5.1)
ALK PHOS: 86 U/L (ref 40–115)
ALT: 9 U/L (ref 9–46)
AST: 13 U/L (ref 10–35)
BILIRUBIN TOTAL: 0.7 mg/dL (ref 0.2–1.2)
BUN: 15 mg/dL (ref 7–25)
CALCIUM: 8.7 mg/dL (ref 8.6–10.3)
CO2: 29 mmol/L (ref 20–31)
Chloride: 102 mmol/L (ref 98–110)
Creat: 1.14 mg/dL — ABNORMAL HIGH (ref 0.70–1.11)
Glucose, Bld: 132 mg/dL — ABNORMAL HIGH (ref 65–99)
Potassium: 4 mmol/L (ref 3.5–5.3)
Sodium: 140 mmol/L (ref 135–146)
TOTAL PROTEIN: 6.3 g/dL (ref 6.1–8.1)

## 2015-05-10 LAB — TSH: TSH: 4.44 m[IU]/L (ref 0.40–4.50)

## 2015-05-14 ENCOUNTER — Encounter: Payer: Self-pay | Admitting: *Deleted

## 2015-06-13 DIAGNOSIS — E784 Other hyperlipidemia: Secondary | ICD-10-CM | POA: Diagnosis not present

## 2015-06-13 DIAGNOSIS — E119 Type 2 diabetes mellitus without complications: Secondary | ICD-10-CM | POA: Diagnosis not present

## 2015-06-13 DIAGNOSIS — Z6835 Body mass index (BMI) 35.0-35.9, adult: Secondary | ICD-10-CM | POA: Diagnosis not present

## 2015-06-13 DIAGNOSIS — I480 Paroxysmal atrial fibrillation: Secondary | ICD-10-CM | POA: Diagnosis not present

## 2015-06-13 DIAGNOSIS — I10 Essential (primary) hypertension: Secondary | ICD-10-CM | POA: Diagnosis not present

## 2015-06-15 ENCOUNTER — Ambulatory Visit (INDEPENDENT_AMBULATORY_CARE_PROVIDER_SITE_OTHER): Payer: PPO | Admitting: Pharmacist Clinician (PhC)/ Clinical Pharmacy Specialist

## 2015-06-15 DIAGNOSIS — I4891 Unspecified atrial fibrillation: Secondary | ICD-10-CM | POA: Diagnosis not present

## 2015-06-15 DIAGNOSIS — Z7901 Long term (current) use of anticoagulants: Secondary | ICD-10-CM | POA: Diagnosis not present

## 2015-06-15 LAB — POCT INR: INR: 2.1

## 2015-07-13 ENCOUNTER — Other Ambulatory Visit: Payer: Self-pay

## 2015-07-16 MED ORDER — WARFARIN SODIUM 2.5 MG PO TABS: ORAL_TABLET | ORAL | Status: DC

## 2015-07-27 ENCOUNTER — Ambulatory Visit (INDEPENDENT_AMBULATORY_CARE_PROVIDER_SITE_OTHER): Payer: PPO | Admitting: Pharmacist Clinician (PhC)/ Clinical Pharmacy Specialist

## 2015-07-27 DIAGNOSIS — Z7901 Long term (current) use of anticoagulants: Secondary | ICD-10-CM

## 2015-07-27 DIAGNOSIS — I4891 Unspecified atrial fibrillation: Secondary | ICD-10-CM | POA: Diagnosis not present

## 2015-07-27 LAB — POCT INR: INR: 2.2

## 2015-08-06 ENCOUNTER — Other Ambulatory Visit: Payer: Self-pay | Admitting: Cardiovascular Disease

## 2015-08-06 DIAGNOSIS — Z6835 Body mass index (BMI) 35.0-35.9, adult: Secondary | ICD-10-CM | POA: Diagnosis not present

## 2015-08-06 DIAGNOSIS — L03114 Cellulitis of left upper limb: Secondary | ICD-10-CM | POA: Diagnosis not present

## 2015-08-09 DIAGNOSIS — M810 Age-related osteoporosis without current pathological fracture: Secondary | ICD-10-CM | POA: Diagnosis not present

## 2015-08-09 DIAGNOSIS — Z6835 Body mass index (BMI) 35.0-35.9, adult: Secondary | ICD-10-CM | POA: Diagnosis not present

## 2015-08-09 DIAGNOSIS — E784 Other hyperlipidemia: Secondary | ICD-10-CM | POA: Diagnosis not present

## 2015-08-09 DIAGNOSIS — L03114 Cellulitis of left upper limb: Secondary | ICD-10-CM | POA: Diagnosis not present

## 2015-08-09 DIAGNOSIS — I10 Essential (primary) hypertension: Secondary | ICD-10-CM | POA: Diagnosis not present

## 2015-08-09 DIAGNOSIS — E119 Type 2 diabetes mellitus without complications: Secondary | ICD-10-CM | POA: Diagnosis not present

## 2015-09-07 ENCOUNTER — Ambulatory Visit (INDEPENDENT_AMBULATORY_CARE_PROVIDER_SITE_OTHER): Payer: PPO | Admitting: Pharmacist Clinician (PhC)/ Clinical Pharmacy Specialist

## 2015-09-07 DIAGNOSIS — I4891 Unspecified atrial fibrillation: Secondary | ICD-10-CM | POA: Diagnosis not present

## 2015-09-07 DIAGNOSIS — Z7901 Long term (current) use of anticoagulants: Secondary | ICD-10-CM | POA: Diagnosis not present

## 2015-09-07 LAB — POCT INR: INR: 2.2

## 2015-10-19 ENCOUNTER — Ambulatory Visit (INDEPENDENT_AMBULATORY_CARE_PROVIDER_SITE_OTHER): Payer: PPO | Admitting: Pharmacist

## 2015-10-19 DIAGNOSIS — Z7901 Long term (current) use of anticoagulants: Secondary | ICD-10-CM

## 2015-10-19 DIAGNOSIS — I4891 Unspecified atrial fibrillation: Secondary | ICD-10-CM | POA: Diagnosis not present

## 2015-10-19 LAB — POCT INR: INR: 2.5

## 2015-11-07 DIAGNOSIS — E784 Other hyperlipidemia: Secondary | ICD-10-CM | POA: Diagnosis not present

## 2015-11-07 DIAGNOSIS — M810 Age-related osteoporosis without current pathological fracture: Secondary | ICD-10-CM | POA: Diagnosis not present

## 2015-11-07 DIAGNOSIS — Z23 Encounter for immunization: Secondary | ICD-10-CM | POA: Diagnosis not present

## 2015-11-07 DIAGNOSIS — E119 Type 2 diabetes mellitus without complications: Secondary | ICD-10-CM | POA: Diagnosis not present

## 2015-11-07 DIAGNOSIS — N401 Enlarged prostate with lower urinary tract symptoms: Secondary | ICD-10-CM | POA: Diagnosis not present

## 2015-11-07 DIAGNOSIS — I10 Essential (primary) hypertension: Secondary | ICD-10-CM | POA: Diagnosis not present

## 2015-11-07 DIAGNOSIS — Z6834 Body mass index (BMI) 34.0-34.9, adult: Secondary | ICD-10-CM | POA: Diagnosis not present

## 2015-11-07 DIAGNOSIS — E785 Hyperlipidemia, unspecified: Secondary | ICD-10-CM | POA: Diagnosis not present

## 2015-11-15 DIAGNOSIS — H18413 Arcus senilis, bilateral: Secondary | ICD-10-CM | POA: Diagnosis not present

## 2015-11-15 DIAGNOSIS — E119 Type 2 diabetes mellitus without complications: Secondary | ICD-10-CM | POA: Diagnosis not present

## 2015-11-15 DIAGNOSIS — Z961 Presence of intraocular lens: Secondary | ICD-10-CM | POA: Diagnosis not present

## 2015-11-15 DIAGNOSIS — Z7984 Long term (current) use of oral hypoglycemic drugs: Secondary | ICD-10-CM | POA: Diagnosis not present

## 2015-11-15 DIAGNOSIS — H43812 Vitreous degeneration, left eye: Secondary | ICD-10-CM | POA: Diagnosis not present

## 2015-11-30 ENCOUNTER — Ambulatory Visit (INDEPENDENT_AMBULATORY_CARE_PROVIDER_SITE_OTHER): Payer: PPO | Admitting: Pharmacist

## 2015-11-30 DIAGNOSIS — Z7901 Long term (current) use of anticoagulants: Secondary | ICD-10-CM | POA: Diagnosis not present

## 2015-11-30 DIAGNOSIS — I4891 Unspecified atrial fibrillation: Secondary | ICD-10-CM | POA: Diagnosis not present

## 2015-11-30 LAB — POCT INR: INR: 3.4

## 2015-12-28 ENCOUNTER — Ambulatory Visit (INDEPENDENT_AMBULATORY_CARE_PROVIDER_SITE_OTHER): Payer: PPO | Admitting: Pharmacist

## 2015-12-28 DIAGNOSIS — Z7901 Long term (current) use of anticoagulants: Secondary | ICD-10-CM | POA: Diagnosis not present

## 2015-12-28 DIAGNOSIS — I4891 Unspecified atrial fibrillation: Secondary | ICD-10-CM | POA: Diagnosis not present

## 2015-12-28 LAB — POCT INR: INR: 3.3

## 2016-01-25 ENCOUNTER — Ambulatory Visit (INDEPENDENT_AMBULATORY_CARE_PROVIDER_SITE_OTHER): Payer: PPO | Admitting: Pharmacist

## 2016-01-25 DIAGNOSIS — Z7901 Long term (current) use of anticoagulants: Secondary | ICD-10-CM | POA: Diagnosis not present

## 2016-01-25 DIAGNOSIS — I4891 Unspecified atrial fibrillation: Secondary | ICD-10-CM | POA: Diagnosis not present

## 2016-01-25 LAB — POCT INR: INR: 2.4

## 2016-02-08 DIAGNOSIS — M109 Gout, unspecified: Secondary | ICD-10-CM | POA: Diagnosis not present

## 2016-02-08 DIAGNOSIS — E784 Other hyperlipidemia: Secondary | ICD-10-CM | POA: Diagnosis not present

## 2016-02-08 DIAGNOSIS — E119 Type 2 diabetes mellitus without complications: Secondary | ICD-10-CM | POA: Diagnosis not present

## 2016-02-08 DIAGNOSIS — M810 Age-related osteoporosis without current pathological fracture: Secondary | ICD-10-CM | POA: Diagnosis not present

## 2016-02-08 DIAGNOSIS — R946 Abnormal results of thyroid function studies: Secondary | ICD-10-CM | POA: Diagnosis not present

## 2016-02-08 DIAGNOSIS — I10 Essential (primary) hypertension: Secondary | ICD-10-CM | POA: Diagnosis not present

## 2016-02-15 DIAGNOSIS — Z6835 Body mass index (BMI) 35.0-35.9, adult: Secondary | ICD-10-CM | POA: Diagnosis not present

## 2016-02-15 DIAGNOSIS — I10 Essential (primary) hypertension: Secondary | ICD-10-CM | POA: Diagnosis not present

## 2016-02-15 DIAGNOSIS — E119 Type 2 diabetes mellitus without complications: Secondary | ICD-10-CM | POA: Diagnosis not present

## 2016-02-15 DIAGNOSIS — N182 Chronic kidney disease, stage 2 (mild): Secondary | ICD-10-CM | POA: Diagnosis not present

## 2016-02-15 DIAGNOSIS — E784 Other hyperlipidemia: Secondary | ICD-10-CM | POA: Diagnosis not present

## 2016-02-15 DIAGNOSIS — I130 Hypertensive heart and chronic kidney disease with heart failure and stage 1 through stage 4 chronic kidney disease, or unspecified chronic kidney disease: Secondary | ICD-10-CM | POA: Diagnosis not present

## 2016-02-15 DIAGNOSIS — I480 Paroxysmal atrial fibrillation: Secondary | ICD-10-CM | POA: Diagnosis not present

## 2016-02-15 DIAGNOSIS — N390 Urinary tract infection, site not specified: Secondary | ICD-10-CM | POA: Diagnosis not present

## 2016-02-15 DIAGNOSIS — Z0000 Encounter for general adult medical examination without abnormal findings: Secondary | ICD-10-CM | POA: Diagnosis not present

## 2016-02-15 DIAGNOSIS — J90 Pleural effusion, not elsewhere classified: Secondary | ICD-10-CM | POA: Diagnosis not present

## 2016-02-15 DIAGNOSIS — Z1389 Encounter for screening for other disorder: Secondary | ICD-10-CM | POA: Diagnosis not present

## 2016-02-15 DIAGNOSIS — I509 Heart failure, unspecified: Secondary | ICD-10-CM | POA: Diagnosis not present

## 2016-02-19 ENCOUNTER — Other Ambulatory Visit: Payer: Self-pay | Admitting: Internal Medicine

## 2016-02-19 DIAGNOSIS — R918 Other nonspecific abnormal finding of lung field: Secondary | ICD-10-CM

## 2016-02-22 ENCOUNTER — Ambulatory Visit (INDEPENDENT_AMBULATORY_CARE_PROVIDER_SITE_OTHER): Payer: PPO | Admitting: Pharmacist

## 2016-02-22 DIAGNOSIS — Z7901 Long term (current) use of anticoagulants: Secondary | ICD-10-CM | POA: Diagnosis not present

## 2016-02-22 DIAGNOSIS — I4891 Unspecified atrial fibrillation: Secondary | ICD-10-CM | POA: Diagnosis not present

## 2016-02-22 LAB — POCT INR: INR: 1.2

## 2016-02-29 ENCOUNTER — Ambulatory Visit (INDEPENDENT_AMBULATORY_CARE_PROVIDER_SITE_OTHER): Payer: PPO | Admitting: Pharmacist Clinician (PhC)/ Clinical Pharmacy Specialist

## 2016-02-29 DIAGNOSIS — Z7901 Long term (current) use of anticoagulants: Secondary | ICD-10-CM | POA: Diagnosis not present

## 2016-02-29 DIAGNOSIS — I4891 Unspecified atrial fibrillation: Secondary | ICD-10-CM | POA: Diagnosis not present

## 2016-02-29 LAB — POCT INR: INR: 1.5

## 2016-03-07 ENCOUNTER — Ambulatory Visit
Admission: RE | Admit: 2016-03-07 | Discharge: 2016-03-07 | Disposition: A | Discharge status: Home / Self Care | Payer: PPO | Source: Ambulatory Visit | Attending: Internal Medicine | Admitting: Internal Medicine

## 2016-03-07 DIAGNOSIS — Z6835 Body mass index (BMI) 35.0-35.9, adult: Secondary | ICD-10-CM | POA: Diagnosis not present

## 2016-03-07 DIAGNOSIS — I2699 Other pulmonary embolism without acute cor pulmonale: Secondary | ICD-10-CM | POA: Diagnosis not present

## 2016-03-07 DIAGNOSIS — R918 Other nonspecific abnormal finding of lung field: Secondary | ICD-10-CM

## 2016-03-07 MED ORDER — IOPAMIDOL (ISOVUE-300) INJECTION 61%
75.0000 mL | Freq: Once | INTRAVENOUS | Status: AC | PRN
  Administered 2016-03-07: 75 mL via INTRAVENOUS

## 2016-05-05 ENCOUNTER — Ambulatory Visit (INDEPENDENT_AMBULATORY_CARE_PROVIDER_SITE_OTHER): Payer: PPO | Admitting: Cardiovascular Disease

## 2016-05-05 ENCOUNTER — Ambulatory Visit: Payer: PPO | Admitting: Pharmacist Clinician (PhC)/ Clinical Pharmacy Specialist

## 2016-05-05 ENCOUNTER — Encounter: Payer: Self-pay | Admitting: Cardiovascular Disease

## 2016-05-05 VITALS — BP 136/78 | HR 76 | Ht 67.5 in | Wt 240.0 lb

## 2016-05-05 DIAGNOSIS — Z7901 Long term (current) use of anticoagulants: Secondary | ICD-10-CM

## 2016-05-05 DIAGNOSIS — I4891 Unspecified atrial fibrillation: Secondary | ICD-10-CM

## 2016-05-05 DIAGNOSIS — E119 Type 2 diabetes mellitus without complications: Secondary | ICD-10-CM | POA: Diagnosis not present

## 2016-05-05 DIAGNOSIS — I482 Chronic atrial fibrillation: Secondary | ICD-10-CM

## 2016-05-05 DIAGNOSIS — I10 Essential (primary) hypertension: Secondary | ICD-10-CM

## 2016-05-05 DIAGNOSIS — I4821 Permanent atrial fibrillation: Secondary | ICD-10-CM

## 2016-05-05 DIAGNOSIS — I4510 Unspecified right bundle-branch block: Secondary | ICD-10-CM

## 2016-05-05 NOTE — Patient Instructions (Signed)
Your physician wants you to follow-up in: 1 year or sooner if needed. You will receive a reminder letter in the mail two months in advance. If you don't receive a letter, please call our office to schedule the follow-up appointment.   If you need a refill on your cardiac medications before your next appointment, please call your pharmacy.   

## 2016-05-05 NOTE — Progress Notes (Signed)
**Note Alex-Identified via Obfuscation** Patient ID: DEMONE [name], male   DOB: 10-14-1920, 83 y.o.   MRN: [id number]     Primary M.D.: Dr. Velna Hatchet  HPI: Alex [name] is a 83 y.o. male  presents to the office for an 50 month cardiology evaluation.  Alex [name] has a history of permanent atrial fibrillation and is on anticoagulation.  He has a history of hypertension with diastolic dysfunction and concentric left ventricular hypertrophy, as well as a history of hyperlipidemia and type 2 diabetes mellitus. He has a history of ACE-induced cough. He also has a history of pleural effusion and previously underwent that procedure which was negative for malignancy.  He has a history of gout and is on allopurinol and colchicine.  Due to bradycardia in the past his beta blocker dose has been reduced to Toprol 25 mg daily.  He continues to be on losartan 100 mg daily,Lasix 40 mg daily, and amlodipine 5 mg for blood pressure control. He is diabetic on Glucotrol and metformin.  Since I last saw him, his Coumadin was changed to eliquis and is taking this at 5 mg twice a day.   He has right bundle branch block which is chronic.    He had recently seen his primary physician who had checked a complete set of laboratory one week ago.  He remains active and goes to the [facility]  where he rides continues to ride a stationary bicycle for up to 30 minutes at a time.  He denies change in exercise tolerance.  He denies any chest pressure.  He denies any episodes of tachycardia.  He denies presyncope or syncope.  At times he notices very minimal ankle swelling.  He presents for evaluation.   Past Medical History:  Diagnosis Date  . Abnormal chest sounds   . Atrial fibrillation, chronic ([place]) 10/22/2010   echo - EF >55%; impaired LV relaxation; LA mod dilated; RA mod dilated; mod mitral annular calcification; mod tricuspid regurgitation, RVsystolic pressure [id number];   . CHF (congestive heart failure) ([place]) 07/24/2009   echo - EF 55-65%; severe LVH; severe  concentric hypertrophy; grade 1 diastolic dysfunction; aortic valve - mod regurgitation; R pleural effusion  . Diastolic dysfunction   . Dyslipidemia   . Edema   . Essential hypertension, malignant   . Left ventricular hypertrophy    concentric  . Pleural effusion   . Pleural effusion on right   . RBBB 10/06/2008   Myoview - EF 65%; normal perfusion all regions, no evidence of inducible myocaridal ischemia; normal perfusion study  . Type II or unspecified type diabetes mellitus without mention of complication, not stated as uncontrolled   . Varicose vein 03/20/2010   doppler - no evidence of thrombus, thrombophlebitis, or venous insufficiency    Past Surgical History:  Procedure Laterality Date  . HEMORROIDECTOMY    . VIDEO ASSISTED THORACOSCOPY     rt VATs , drainage of plueral effusion, pleural biopsies, talc pleurodesis    Allergies  Allergen Reactions  . Penicillins Hives    Current Outpatient Prescriptions  Medication Sig Dispense Refill  . alendronate (FOSAMAX) 35 MG tablet Take 35 mg by mouth every 7 (seven) days. Take with a full glass of water on an empty stomach.    Marland Kitchen allopurinol (ZYLOPRIM) 100 MG tablet Take 100 mg by mouth daily.     Marland Kitchen amLODipine (NORVASC) 5 MG tablet TAKE 1 TAB BY MOUTH ONCE DAILY. NEEDS APPT FOR MORE REFILLS. 30 tablet 0  . apixaban (ELIQUIS) 5 MG  TABS tablet Take 5 mg by mouth 2 (two) times daily.    Marland Kitchen atorvastatin (LIPITOR) 20 MG tablet TAKE ONE TABLET BY MOUTH ONE TIME DAILY 90 tablet 2  . colchicine 0.6 MG tablet Take 0.6 mg by mouth 3 (three) times daily as needed.    . furosemide (LASIX) 40 MG tablet Take 1 tablet (40 mg total) by mouth daily. 60 tablet 6  . glipiZIDE (GLUCOTROL) 10 MG 24 hr tablet Take 10 mg by mouth daily.     Marland Kitchen losartan (COZAAR) 100 MG tablet TAKE ONE TABLET BY MOUTH ONE TIME DAILY 30 tablet 0  . metFORMIN (GLUCOPHAGE-XR) 500 MG 24 hr tablet Take 1 tablet by mouth 1 day or 1 dose.     . metoprolol succinate (TOPROL-XL) 25  MG 24 hr tablet Take 0.5 tablets (12.5 mg total) by mouth daily. 45 tablet 3  . tamsulosin (FLOMAX) 0.4 MG CAPS capsule Take 0.4 mg by mouth daily after supper.     No current facility-administered medications for this visit.     Social History   Social History  . Marital status: Married    Spouse name: N/A  . Number of children: N/A  . Years of education: N/A   Occupational History  . retired    Social History Main Topics  . Smoking status: Never Smoker  . Smokeless tobacco: Never Used  . Alcohol use 0.0 oz/week     Comment: Occasional  . Drug use: No  . Sexual activity: Not on file   Other Topics Concern  . Not on file   Social History Narrative  . No narrative on file    History reviewed. No pertinent family history.  ROS General: Negative; No fevers, chills, or night sweats;  HEENT: Negative; No changes in vision or hearing, sinus congestion, difficulty swallowing Pulmonary: Negative; No cough, wheezing, shortness of breath, hemoptysis Cardiovascular: See history of present illness GI: Negative; No nausea, vomiting, diarrhea, or abdominal pain GU: Negative; No dysuria, hematuria, or difficulty voiding Musculoskeletal: Positive for breaking his left pinky. Hematologic/Oncology: Negative; no easy bruising, bleeding Endocrine: Positive for diabetes mellitus Rheumatologic: Positive for gout Neuro: Negative; no changes in balance, headaches Skin: Negative; No rashes or skin lesions Psychiatric: Negative; No behavioral problems, depression Sleep: Negative; No snoring, daytime sleepiness, hypersomnolence, bruxism, restless legs, hypnogognic hallucinations, no cataplexy Other comprehensive 14 point system review is negative.  PE BP 136/78   Pulse 76   Ht 5' 7.5" (1.715 m)   Wt 240 lb (108.9 kg)   BMI 37.03 kg/m   Repeat blood pressure was 122/78.  Wt Readings from Last 3 Encounters:  05/05/16 240 lb (108.9 kg)  05/04/15 242 lb 12.8 oz (110.1 kg)  05/15/14  242 lb (109.8 kg)   General: Alert, oriented, no distress. He appears much younger than his stated age of 66 years. Skin: normal turgor, no rashes HEENT: Normocephalic, atraumatic. Pupils round and reactive; sclera anicteric;no lid lag.  Nose without nasal septal hypertrophy Mouth/Parynx benign; Mallinpatti scale 2 Neck: No JVD, no carotid bruits with normal carotid upstroke Lungs: clear to ausculatation and percussion; no wheezing or rales Heart: iregularly irregular rhythm in the upper 40s, s1 s2 normal 1/6 systolic murmur. No diastolic murmur. No rub. Abdomen: soft, nontender; no hepatosplenomehaly, BS+; abdominal aorta nontender and not dilated by palpation. Back: No CVA tenderness Pulses 2+ Extremities: Trace edema at the ankles/pretibial area.  no clubbing cyanosis , Homan's sign negative; he had recently broken his left pinky which is improved.  Neurologic: grossly nonfocal Psychological: Normal affect and mood   ECG (independently read by me): Atrial fibrillation at 76 bpm, right bundle branch block with repolarization changes.  April 2017 ECG (independently read by me): Atrial fibrillation with a slow ventricular response with rate averaging 48 bpm.  Right bundle-branch block.  May 2016 ECG (independently read by me): Atrial fibrillation with a ventricular rate in the 70s.  Right bundle branch block with repolarization changes.  Prior October 2015 ECG (independently read by me) disease: Atrial fibrillation with a ventricular rate in the 60s, averaging at 64.  Right bundle branch block with repolarization changes.  QTc interval increased.   March 2015 ECG (independently read by me): Atrial fibrillation at 63 beats per minute. Right bundle-branch block with repolarization changes.  Prior ECG: Atrial fibrillation with a ventricular response now in the mid 60s. Right bundle branch block with repolarization changes, unchanged.  LABS:  BMP Latest Ref Rng & Units 05/09/2015 07/25/2010  07/11/2010  Glucose 65 - 99 mg/dL 132(H) 221(H) 135(H)  BUN 7 - 25 mg/dL '15 16 17  '$ Creatinine 0.70 - 1.11 mg/dL 1.14(H) 0.90 0.99  Sodium 135 - 146 mmol/L 140 138 133(L)  Potassium 3.5 - 5.3 mmol/L 4.0 3.5 4.1  Chloride 98 - 110 mmol/L 102 99 100  CO2 20 - 31 mmol/L '29 28 26  '$ Calcium 8.6 - 10.3 mg/dL 8.7 8.7 7.6(L)   Hepatic Function Latest Ref Rng & Units 05/09/2015 06/23/2012 07/03/2010  Total Protein 6.1 - 8.1 g/dL 6.3 6.5 6.0  Albumin 3.6 - 5.1 g/dL 3.7 4.1 2.7(L)  AST 10 - 35 U/L '13 18 15  '$ ALT 9 - 46 U/L '9 16 9  '$ Alk Phosphatase 40 - 115 U/L 86 85 108  Total Bilirubin 0.2 - 1.2 mg/dL 0.7 1.1 0.5  Bilirubin, Direct 0.0 - 0.3 mg/dL - 0.2 -   CBC Latest Ref Rng & Units 05/09/2015 07/25/2010 07/11/2010  WBC 3.8 - 10.8 K/uL 7.6 10.1 10.0  Hemoglobin 13.2 - 17.1 g/dL 14.3 12.6(L) 10.2(L)  Hematocrit 38.5 - 50.0 % 42.8 37.9(L) 30.8(L)  Platelets 140 - 400 K/uL 207 394 171   Lab Results  Component Value Date   TSH 4.44 05/09/2015    Lipid Panel     Component Value Date/Time   CHOL 129 05/09/2015 0935   TRIG 64 05/09/2015 0935   HDL 57 05/09/2015 0935   CHOLHDL 2.3 05/09/2015 0935   VLDL 13 05/09/2015 0935   LDLCALC 59 05/09/2015 0935    RADIOLOGY: No results found.  IMPRESSION:  1. Permanent atrial fibrillation ([place])   2. Essential hypertension   3. RBBB   4. Long term current use of anticoagulant therapy   5. Type 2 diabetes mellitus without complication, without long-term current use of insulin ([place])      ASSESSMENT AND PLAN: Mr. Montanye is a young appearing 83 year old Caucasian male who remains fairly active. He has a history of permanent atrial fibrillation and over the past year was switched from  warfarin to eliquis for anticoagulation therapy.  He denies any bleeding.  When I last saw him, he was significantly bradycardic and I reduced Toprol-XL to 12.5 mg.  His atrial fibrillation rate is currently controlled and on ECG today was in the 70s.  He is unaware of any  fast heartbeats and is unaware of his irregular rhythm.  His blood pressure today is controlled on his medical regimen consisting of amlodipine 5 mg, losartan 100 mg, Toprol-XL 12.5 mg, in addition  to his furosemide.  There is minimal if any edema today.  He has not had any further gout episodes and continues to be on allupurinol.  He is tolerating atorvastatin 20 mg for his hyperlipidemia.  He denies myalgias.  He is on metformin as well as glipizide for his type 2 diabetes mellitus.  He had just under gone blood work.  I will try to get these results from his primary physician.  He continues to be stable from a cardiovascular standpoint.  I encouraged his continued exercise program.  As long as he remains stable I'll see him in one year for reevaluation.  Time spent: 25 minutes.  Troy Sine, MD, [facility]  05/07/2016 7:49 AM

## 2016-05-19 DIAGNOSIS — Z6835 Body mass index (BMI) 35.0-35.9, adult: Secondary | ICD-10-CM | POA: Diagnosis not present

## 2016-05-19 DIAGNOSIS — I480 Paroxysmal atrial fibrillation: Secondary | ICD-10-CM | POA: Diagnosis not present

## 2016-05-19 DIAGNOSIS — E119 Type 2 diabetes mellitus without complications: Secondary | ICD-10-CM | POA: Diagnosis not present

## 2016-05-19 DIAGNOSIS — Z1389 Encounter for screening for other disorder: Secondary | ICD-10-CM | POA: Diagnosis not present

## 2016-05-19 DIAGNOSIS — I10 Essential (primary) hypertension: Secondary | ICD-10-CM | POA: Diagnosis not present

## 2016-05-19 DIAGNOSIS — E784 Other hyperlipidemia: Secondary | ICD-10-CM | POA: Diagnosis not present

## 2016-05-20 ENCOUNTER — Other Ambulatory Visit: Payer: Self-pay | Admitting: Cardiovascular Disease

## 2016-05-21 NOTE — Telephone Encounter (Signed)
REFILL 

## 2016-09-17 ENCOUNTER — Other Ambulatory Visit: Payer: Self-pay | Admitting: Internal Medicine

## 2016-09-17 DIAGNOSIS — I509 Heart failure, unspecified: Secondary | ICD-10-CM | POA: Diagnosis not present

## 2016-09-17 DIAGNOSIS — Z6837 Body mass index (BMI) 37.0-37.9, adult: Secondary | ICD-10-CM | POA: Diagnosis not present

## 2016-09-17 DIAGNOSIS — Z23 Encounter for immunization: Secondary | ICD-10-CM | POA: Diagnosis not present

## 2016-09-17 DIAGNOSIS — I2699 Other pulmonary embolism without acute cor pulmonale: Secondary | ICD-10-CM | POA: Diagnosis not present

## 2016-09-17 DIAGNOSIS — I10 Essential (primary) hypertension: Secondary | ICD-10-CM | POA: Diagnosis not present

## 2016-09-17 DIAGNOSIS — N401 Enlarged prostate with lower urinary tract symptoms: Secondary | ICD-10-CM | POA: Diagnosis not present

## 2016-09-17 DIAGNOSIS — I130 Hypertensive heart and chronic kidney disease with heart failure and stage 1 through stage 4 chronic kidney disease, or unspecified chronic kidney disease: Secondary | ICD-10-CM | POA: Diagnosis not present

## 2016-09-17 DIAGNOSIS — E784 Other hyperlipidemia: Secondary | ICD-10-CM | POA: Diagnosis not present

## 2016-09-17 DIAGNOSIS — E119 Type 2 diabetes mellitus without complications: Secondary | ICD-10-CM | POA: Diagnosis not present

## 2016-09-17 DIAGNOSIS — N183 Chronic kidney disease, stage 3 (moderate): Secondary | ICD-10-CM | POA: Diagnosis not present

## 2016-09-22 ENCOUNTER — Ambulatory Visit (HOSPITAL_COMMUNITY): Payer: PPO | Attending: Cardiovascular Disease

## 2016-09-22 ENCOUNTER — Other Ambulatory Visit: Payer: Self-pay

## 2016-09-22 DIAGNOSIS — I509 Heart failure, unspecified: Secondary | ICD-10-CM

## 2016-09-22 DIAGNOSIS — E119 Type 2 diabetes mellitus without complications: Secondary | ICD-10-CM | POA: Diagnosis not present

## 2016-09-22 DIAGNOSIS — R001 Bradycardia, unspecified: Secondary | ICD-10-CM | POA: Diagnosis not present

## 2016-09-22 DIAGNOSIS — E785 Hyperlipidemia, unspecified: Secondary | ICD-10-CM | POA: Diagnosis not present

## 2016-09-22 DIAGNOSIS — I110 Hypertensive heart disease with heart failure: Secondary | ICD-10-CM | POA: Diagnosis not present

## 2016-09-22 DIAGNOSIS — I4891 Unspecified atrial fibrillation: Secondary | ICD-10-CM | POA: Diagnosis not present

## 2016-11-17 DIAGNOSIS — E119 Type 2 diabetes mellitus without complications: Secondary | ICD-10-CM | POA: Diagnosis not present

## 2016-12-31 DIAGNOSIS — E7849 Other hyperlipidemia: Secondary | ICD-10-CM | POA: Diagnosis not present

## 2016-12-31 DIAGNOSIS — Z6837 Body mass index (BMI) 37.0-37.9, adult: Secondary | ICD-10-CM | POA: Diagnosis not present

## 2016-12-31 DIAGNOSIS — N401 Enlarged prostate with lower urinary tract symptoms: Secondary | ICD-10-CM | POA: Diagnosis not present

## 2016-12-31 DIAGNOSIS — I480 Paroxysmal atrial fibrillation: Secondary | ICD-10-CM | POA: Diagnosis not present

## 2016-12-31 DIAGNOSIS — I130 Hypertensive heart and chronic kidney disease with heart failure and stage 1 through stage 4 chronic kidney disease, or unspecified chronic kidney disease: Secondary | ICD-10-CM | POA: Diagnosis not present

## 2016-12-31 DIAGNOSIS — I10 Essential (primary) hypertension: Secondary | ICD-10-CM | POA: Diagnosis not present

## 2016-12-31 DIAGNOSIS — E119 Type 2 diabetes mellitus without complications: Secondary | ICD-10-CM | POA: Diagnosis not present

## 2016-12-31 DIAGNOSIS — N183 Chronic kidney disease, stage 3 (moderate): Secondary | ICD-10-CM | POA: Diagnosis not present

## 2016-12-31 DIAGNOSIS — I509 Heart failure, unspecified: Secondary | ICD-10-CM | POA: Diagnosis not present

## 2017-02-18 DIAGNOSIS — M810 Age-related osteoporosis without current pathological fracture: Secondary | ICD-10-CM | POA: Diagnosis not present

## 2017-02-18 DIAGNOSIS — N183 Chronic kidney disease, stage 3 (moderate): Secondary | ICD-10-CM | POA: Diagnosis not present

## 2017-02-18 DIAGNOSIS — E119 Type 2 diabetes mellitus without complications: Secondary | ICD-10-CM | POA: Diagnosis not present

## 2017-02-18 DIAGNOSIS — M109 Gout, unspecified: Secondary | ICD-10-CM | POA: Diagnosis not present

## 2017-02-18 DIAGNOSIS — E559 Vitamin D deficiency, unspecified: Secondary | ICD-10-CM | POA: Diagnosis not present

## 2017-02-18 DIAGNOSIS — E7849 Other hyperlipidemia: Secondary | ICD-10-CM | POA: Diagnosis not present

## 2017-02-23 DIAGNOSIS — R946 Abnormal results of thyroid function studies: Secondary | ICD-10-CM | POA: Diagnosis not present

## 2017-02-25 DIAGNOSIS — N183 Chronic kidney disease, stage 3 (moderate): Secondary | ICD-10-CM | POA: Diagnosis not present

## 2017-02-25 DIAGNOSIS — I509 Heart failure, unspecified: Secondary | ICD-10-CM | POA: Diagnosis not present

## 2017-02-25 DIAGNOSIS — M810 Age-related osteoporosis without current pathological fracture: Secondary | ICD-10-CM | POA: Diagnosis not present

## 2017-02-25 DIAGNOSIS — M109 Gout, unspecified: Secondary | ICD-10-CM | POA: Diagnosis not present

## 2017-02-25 DIAGNOSIS — Z6837 Body mass index (BMI) 37.0-37.9, adult: Secondary | ICD-10-CM | POA: Diagnosis not present

## 2017-02-25 DIAGNOSIS — I130 Hypertensive heart and chronic kidney disease with heart failure and stage 1 through stage 4 chronic kidney disease, or unspecified chronic kidney disease: Secondary | ICD-10-CM | POA: Diagnosis not present

## 2017-02-25 DIAGNOSIS — I10 Essential (primary) hypertension: Secondary | ICD-10-CM | POA: Diagnosis not present

## 2017-02-25 DIAGNOSIS — R82998 Other abnormal findings in urine: Secondary | ICD-10-CM | POA: Diagnosis not present

## 2017-02-25 DIAGNOSIS — Z1389 Encounter for screening for other disorder: Secondary | ICD-10-CM | POA: Diagnosis not present

## 2017-02-25 DIAGNOSIS — E7849 Other hyperlipidemia: Secondary | ICD-10-CM | POA: Diagnosis not present

## 2017-02-25 DIAGNOSIS — I480 Paroxysmal atrial fibrillation: Secondary | ICD-10-CM | POA: Diagnosis not present

## 2017-02-25 DIAGNOSIS — Z0000 Encounter for general adult medical examination without abnormal findings: Secondary | ICD-10-CM | POA: Diagnosis not present

## 2017-04-26 ENCOUNTER — Other Ambulatory Visit: Payer: Self-pay | Admitting: Cardiovascular Disease

## 2017-06-26 DIAGNOSIS — N183 Chronic kidney disease, stage 3 (moderate): Secondary | ICD-10-CM | POA: Diagnosis not present

## 2017-06-26 DIAGNOSIS — E1169 Type 2 diabetes mellitus with other specified complication: Secondary | ICD-10-CM | POA: Diagnosis not present

## 2017-06-26 DIAGNOSIS — I130 Hypertensive heart and chronic kidney disease with heart failure and stage 1 through stage 4 chronic kidney disease, or unspecified chronic kidney disease: Secondary | ICD-10-CM | POA: Diagnosis not present

## 2017-06-26 DIAGNOSIS — I509 Heart failure, unspecified: Secondary | ICD-10-CM | POA: Diagnosis not present

## 2017-06-26 DIAGNOSIS — Z7901 Long term (current) use of anticoagulants: Secondary | ICD-10-CM | POA: Diagnosis not present

## 2017-06-26 DIAGNOSIS — Z6836 Body mass index (BMI) 36.0-36.9, adult: Secondary | ICD-10-CM | POA: Diagnosis not present

## 2017-06-26 DIAGNOSIS — D692 Other nonthrombocytopenic purpura: Secondary | ICD-10-CM | POA: Diagnosis not present

## 2017-06-26 DIAGNOSIS — I10 Essential (primary) hypertension: Secondary | ICD-10-CM | POA: Diagnosis not present

## 2017-06-26 DIAGNOSIS — I480 Paroxysmal atrial fibrillation: Secondary | ICD-10-CM | POA: Diagnosis not present

## 2017-11-26 DIAGNOSIS — I10 Essential (primary) hypertension: Secondary | ICD-10-CM | POA: Diagnosis not present

## 2017-11-26 DIAGNOSIS — Z6836 Body mass index (BMI) 36.0-36.9, adult: Secondary | ICD-10-CM | POA: Diagnosis not present

## 2017-11-26 DIAGNOSIS — E1169 Type 2 diabetes mellitus with other specified complication: Secondary | ICD-10-CM | POA: Diagnosis not present

## 2017-11-26 DIAGNOSIS — Z23 Encounter for immunization: Secondary | ICD-10-CM | POA: Diagnosis not present

## 2018-02-26 DIAGNOSIS — R82998 Other abnormal findings in urine: Secondary | ICD-10-CM | POA: Diagnosis not present

## 2018-02-26 DIAGNOSIS — M109 Gout, unspecified: Secondary | ICD-10-CM | POA: Diagnosis not present

## 2018-02-26 DIAGNOSIS — E1169 Type 2 diabetes mellitus with other specified complication: Secondary | ICD-10-CM | POA: Diagnosis not present

## 2018-02-26 DIAGNOSIS — E559 Vitamin D deficiency, unspecified: Secondary | ICD-10-CM | POA: Diagnosis not present

## 2018-02-26 DIAGNOSIS — E7849 Other hyperlipidemia: Secondary | ICD-10-CM | POA: Diagnosis not present

## 2018-02-26 DIAGNOSIS — I10 Essential (primary) hypertension: Secondary | ICD-10-CM | POA: Diagnosis not present

## 2018-03-08 DIAGNOSIS — Z1331 Encounter for screening for depression: Secondary | ICD-10-CM | POA: Diagnosis not present

## 2018-03-08 DIAGNOSIS — Z23 Encounter for immunization: Secondary | ICD-10-CM | POA: Diagnosis not present

## 2018-03-08 DIAGNOSIS — D692 Other nonthrombocytopenic purpura: Secondary | ICD-10-CM | POA: Diagnosis not present

## 2018-03-08 DIAGNOSIS — Z0000 Encounter for general adult medical examination without abnormal findings: Secondary | ICD-10-CM | POA: Diagnosis not present

## 2018-03-08 DIAGNOSIS — N183 Chronic kidney disease, stage 3 (moderate): Secondary | ICD-10-CM | POA: Diagnosis not present

## 2018-03-08 DIAGNOSIS — Z86711 Personal history of pulmonary embolism: Secondary | ICD-10-CM | POA: Diagnosis not present

## 2018-03-08 DIAGNOSIS — I130 Hypertensive heart and chronic kidney disease with heart failure and stage 1 through stage 4 chronic kidney disease, or unspecified chronic kidney disease: Secondary | ICD-10-CM | POA: Diagnosis not present

## 2018-03-08 DIAGNOSIS — Z7901 Long term (current) use of anticoagulants: Secondary | ICD-10-CM | POA: Diagnosis not present

## 2018-03-08 DIAGNOSIS — Z6835 Body mass index (BMI) 35.0-35.9, adult: Secondary | ICD-10-CM | POA: Diagnosis not present

## 2018-03-08 DIAGNOSIS — E559 Vitamin D deficiency, unspecified: Secondary | ICD-10-CM | POA: Diagnosis not present

## 2018-03-08 DIAGNOSIS — Z1339 Encounter for screening examination for other mental health and behavioral disorders: Secondary | ICD-10-CM | POA: Diagnosis not present

## 2018-03-08 DIAGNOSIS — M810 Age-related osteoporosis without current pathological fracture: Secondary | ICD-10-CM | POA: Diagnosis not present

## 2018-05-04 ENCOUNTER — Other Ambulatory Visit: Payer: Self-pay | Admitting: Cardiovascular Disease

## 2018-05-04 NOTE — Telephone Encounter (Signed)
Metoprolol Succ 25 mg refilled 

## 2018-07-26 DIAGNOSIS — E1169 Type 2 diabetes mellitus with other specified complication: Secondary | ICD-10-CM | POA: Diagnosis not present

## 2018-07-28 DIAGNOSIS — H35039 Hypertensive retinopathy, unspecified eye: Secondary | ICD-10-CM | POA: Diagnosis not present

## 2018-07-28 DIAGNOSIS — D692 Other nonthrombocytopenic purpura: Secondary | ICD-10-CM | POA: Diagnosis not present

## 2018-07-28 DIAGNOSIS — E1169 Type 2 diabetes mellitus with other specified complication: Secondary | ICD-10-CM | POA: Diagnosis not present

## 2018-07-28 DIAGNOSIS — I480 Paroxysmal atrial fibrillation: Secondary | ICD-10-CM | POA: Diagnosis not present

## 2018-07-28 DIAGNOSIS — I10 Essential (primary) hypertension: Secondary | ICD-10-CM | POA: Diagnosis not present

## 2018-07-28 DIAGNOSIS — Z7901 Long term (current) use of anticoagulants: Secondary | ICD-10-CM | POA: Diagnosis not present

## 2018-07-28 DIAGNOSIS — R413 Other amnesia: Secondary | ICD-10-CM | POA: Diagnosis not present

## 2019-01-30 ENCOUNTER — Ambulatory Visit: Payer: Medicare Other | Attending: Internal Medicine

## 2019-01-30 DIAGNOSIS — Z23 Encounter for immunization: Secondary | ICD-10-CM | POA: Insufficient documentation

## 2019-01-30 NOTE — Progress Notes (Signed)
   Covid-19 Vaccination Clinic  Name:  JERICO GRISSO    MRN: [id number] DOB: 12/23/1920  01/30/2019  Mr. Stamour was observed post Covid-19 immunization for 15 minutes without incidence. He was provided with Vaccine Information Sheet and instruction to access the V-Safe system.   Mr. Hardacre was instructed to call 911 with any severe reactions post vaccine: Marland Kitchen Difficulty breathing  . Swelling of your face and throat  . A fast heartbeat  . A bad rash all over your body  . Dizziness and weakness   Patient was discharged at 11:30.

## 2019-02-17 ENCOUNTER — Ambulatory Visit: Payer: PPO

## 2019-02-21 ENCOUNTER — Ambulatory Visit: Payer: PPO | Attending: Internal Medicine

## 2019-02-21 DIAGNOSIS — Z23 Encounter for immunization: Secondary | ICD-10-CM | POA: Insufficient documentation

## 2019-02-21 NOTE — Progress Notes (Signed)
   Covid-19 Vaccination Clinic  Name:  Alex [name]    MRN: [id number] DOB: 10-01-1920  02/21/2019  Alex [name] was observed post Covid-19 immunization for 15 minutes without incidence. He was provided with Vaccine Information Sheet and instruction to access the V-Safe system.   Alex [name] was instructed to call 911 with any severe reactions post vaccine: Marland Kitchen Difficulty breathing  . Swelling of your face and throat  . A fast heartbeat  . A bad rash all over your body  . Dizziness and weakness    Immunizations Administered    Name Date Dose VIS Date Route   Pfizer COVID-19 Vaccine 02/21/2019 12:32 PM 0.3 mL 12/24/2018 Intramuscular   Manufacturer: [employer], [employer]   Lot: [id number]   NDC: [address]-8

## 2019-03-03 ENCOUNTER — Ambulatory Visit: Payer: PPO

## 2019-04-07 DIAGNOSIS — M810 Age-related osteoporosis without current pathological fracture: Secondary | ICD-10-CM | POA: Diagnosis not present

## 2019-04-07 DIAGNOSIS — E1169 Type 2 diabetes mellitus with other specified complication: Secondary | ICD-10-CM | POA: Diagnosis not present

## 2019-04-07 DIAGNOSIS — E7849 Other hyperlipidemia: Secondary | ICD-10-CM | POA: Diagnosis not present

## 2019-04-07 DIAGNOSIS — M109 Gout, unspecified: Secondary | ICD-10-CM | POA: Diagnosis not present

## 2019-04-20 DIAGNOSIS — D692 Other nonthrombocytopenic purpura: Secondary | ICD-10-CM | POA: Diagnosis not present

## 2019-04-20 DIAGNOSIS — I509 Heart failure, unspecified: Secondary | ICD-10-CM | POA: Diagnosis not present

## 2019-04-20 DIAGNOSIS — Z7901 Long term (current) use of anticoagulants: Secondary | ICD-10-CM | POA: Diagnosis not present

## 2019-04-20 DIAGNOSIS — R82998 Other abnormal findings in urine: Secondary | ICD-10-CM | POA: Diagnosis not present

## 2019-04-20 DIAGNOSIS — R413 Other amnesia: Secondary | ICD-10-CM | POA: Diagnosis not present

## 2019-04-20 DIAGNOSIS — E559 Vitamin D deficiency, unspecified: Secondary | ICD-10-CM | POA: Diagnosis not present

## 2019-04-20 DIAGNOSIS — M810 Age-related osteoporosis without current pathological fracture: Secondary | ICD-10-CM | POA: Diagnosis not present

## 2019-04-20 DIAGNOSIS — Z1331 Encounter for screening for depression: Secondary | ICD-10-CM | POA: Diagnosis not present

## 2019-04-20 DIAGNOSIS — Z86711 Personal history of pulmonary embolism: Secondary | ICD-10-CM | POA: Diagnosis not present

## 2019-04-20 DIAGNOSIS — I130 Hypertensive heart and chronic kidney disease with heart failure and stage 1 through stage 4 chronic kidney disease, or unspecified chronic kidney disease: Secondary | ICD-10-CM | POA: Diagnosis not present

## 2019-04-20 DIAGNOSIS — Z0000 Encounter for general adult medical examination without abnormal findings: Secondary | ICD-10-CM | POA: Diagnosis not present

## 2019-04-20 DIAGNOSIS — I10 Essential (primary) hypertension: Secondary | ICD-10-CM | POA: Diagnosis not present

## 2019-04-20 DIAGNOSIS — N1831 Chronic kidney disease, stage 3a: Secondary | ICD-10-CM | POA: Diagnosis not present

## 2019-04-20 DIAGNOSIS — E1169 Type 2 diabetes mellitus with other specified complication: Secondary | ICD-10-CM | POA: Diagnosis not present

## 2019-05-11 DIAGNOSIS — H20021 Recurrent acute iridocyclitis, right eye: Secondary | ICD-10-CM | POA: Diagnosis not present

## 2019-05-12 ENCOUNTER — Other Ambulatory Visit: Payer: Self-pay | Admitting: Cardiovascular Disease

## 2019-05-13 DIAGNOSIS — H20021 Recurrent acute iridocyclitis, right eye: Secondary | ICD-10-CM | POA: Diagnosis not present

## 2019-05-18 DIAGNOSIS — H20021 Recurrent acute iridocyclitis, right eye: Secondary | ICD-10-CM | POA: Diagnosis not present

## 2019-05-25 DIAGNOSIS — H20021 Recurrent acute iridocyclitis, right eye: Secondary | ICD-10-CM | POA: Diagnosis not present

## 2019-06-01 DIAGNOSIS — H209 Unspecified iridocyclitis: Secondary | ICD-10-CM | POA: Diagnosis not present

## 2019-06-07 DIAGNOSIS — I509 Heart failure, unspecified: Secondary | ICD-10-CM | POA: Diagnosis not present

## 2019-06-07 DIAGNOSIS — H209 Unspecified iridocyclitis: Secondary | ICD-10-CM | POA: Diagnosis not present

## 2019-06-07 DIAGNOSIS — Z7689 Persons encountering health services in other specified circumstances: Secondary | ICD-10-CM | POA: Diagnosis not present

## 2019-06-15 DIAGNOSIS — H209 Unspecified iridocyclitis: Secondary | ICD-10-CM | POA: Diagnosis not present

## 2019-06-20 DIAGNOSIS — Z7689 Persons encountering health services in other specified circumstances: Secondary | ICD-10-CM | POA: Diagnosis not present

## 2019-06-29 DIAGNOSIS — H209 Unspecified iridocyclitis: Secondary | ICD-10-CM | POA: Diagnosis not present

## 2019-07-16 ENCOUNTER — Emergency Department (HOSPITAL_COMMUNITY): Payer: PPO

## 2019-07-16 ENCOUNTER — Other Ambulatory Visit: Payer: Self-pay

## 2019-07-16 ENCOUNTER — Encounter (HOSPITAL_COMMUNITY): Payer: Self-pay | Admitting: Emergency Medicine

## 2019-07-16 ENCOUNTER — Emergency Department (HOSPITAL_COMMUNITY)
Admission: EM | Admit: 2019-07-16 | Discharge: 2019-07-17 | Disposition: A | Discharge status: Home / Self Care | Payer: PPO | Attending: Emergency Medicine | Admitting: Emergency Medicine

## 2019-07-16 DIAGNOSIS — M549 Dorsalgia, unspecified: Secondary | ICD-10-CM | POA: Diagnosis present

## 2019-07-16 DIAGNOSIS — Y998 Other external cause status: Secondary | ICD-10-CM | POA: Diagnosis not present

## 2019-07-16 DIAGNOSIS — S0990XA Unspecified injury of head, initial encounter: Secondary | ICD-10-CM | POA: Insufficient documentation

## 2019-07-16 DIAGNOSIS — I313 Pericardial effusion (noninflammatory): Secondary | ICD-10-CM | POA: Diagnosis not present

## 2019-07-16 DIAGNOSIS — W19XXXA Unspecified fall, initial encounter: Secondary | ICD-10-CM | POA: Diagnosis not present

## 2019-07-16 DIAGNOSIS — I110 Hypertensive heart disease with heart failure: Secondary | ICD-10-CM | POA: Insufficient documentation

## 2019-07-16 DIAGNOSIS — Y9301 Activity, walking, marching and hiking: Secondary | ICD-10-CM | POA: Insufficient documentation

## 2019-07-16 DIAGNOSIS — E119 Type 2 diabetes mellitus without complications: Secondary | ICD-10-CM | POA: Insufficient documentation

## 2019-07-16 DIAGNOSIS — W108XXA Fall (on) (from) other stairs and steps, initial encounter: Secondary | ICD-10-CM | POA: Diagnosis not present

## 2019-07-16 DIAGNOSIS — I700 Atherosclerosis of aorta: Secondary | ICD-10-CM | POA: Diagnosis not present

## 2019-07-16 DIAGNOSIS — J984 Other disorders of lung: Secondary | ICD-10-CM | POA: Diagnosis not present

## 2019-07-16 DIAGNOSIS — I509 Heart failure, unspecified: Secondary | ICD-10-CM | POA: Diagnosis not present

## 2019-07-16 DIAGNOSIS — I6389 Other cerebral infarction: Secondary | ICD-10-CM | POA: Diagnosis not present

## 2019-07-16 DIAGNOSIS — I4891 Unspecified atrial fibrillation: Secondary | ICD-10-CM | POA: Diagnosis not present

## 2019-07-16 DIAGNOSIS — Z7984 Long term (current) use of oral hypoglycemic drugs: Secondary | ICD-10-CM | POA: Diagnosis not present

## 2019-07-16 DIAGNOSIS — I6782 Cerebral ischemia: Secondary | ICD-10-CM | POA: Diagnosis not present

## 2019-07-16 DIAGNOSIS — S299XXA Unspecified injury of thorax, initial encounter: Secondary | ICD-10-CM | POA: Diagnosis not present

## 2019-07-16 DIAGNOSIS — I3139 Other pericardial effusion (noninflammatory): Secondary | ICD-10-CM

## 2019-07-16 DIAGNOSIS — M79603 Pain in arm, unspecified: Secondary | ICD-10-CM | POA: Diagnosis not present

## 2019-07-16 DIAGNOSIS — Z7901 Long term (current) use of anticoagulants: Secondary | ICD-10-CM | POA: Diagnosis not present

## 2019-07-16 DIAGNOSIS — N390 Urinary tract infection, site not specified: Secondary | ICD-10-CM | POA: Diagnosis not present

## 2019-07-16 DIAGNOSIS — Y92009 Unspecified place in unspecified non-institutional (private) residence as the place of occurrence of the external cause: Secondary | ICD-10-CM | POA: Diagnosis not present

## 2019-07-16 DIAGNOSIS — R52 Pain, unspecified: Secondary | ICD-10-CM | POA: Diagnosis not present

## 2019-07-16 DIAGNOSIS — I7090 Unspecified atherosclerosis: Secondary | ICD-10-CM | POA: Diagnosis not present

## 2019-07-16 LAB — URINALYSIS, ROUTINE W REFLEX MICROSCOPIC
Bilirubin Urine: NEGATIVE
Glucose, UA: NEGATIVE mg/dL
Ketones, ur: NEGATIVE mg/dL
Nitrite: NEGATIVE
Protein, ur: 100 mg/dL — AB
Specific Gravity, Urine: 1.015 (ref 1.005–1.030)
pH: 6 (ref 5.0–8.0)

## 2019-07-16 LAB — URINALYSIS, MICROSCOPIC (REFLEX)
Squamous Epithelial / HPF: NONE SEEN (ref 0–5)
WBC, UA: >50 WBC/hpf (ref 0–5)

## 2019-07-16 MED ORDER — SULFAMETHOXAZOLE-TRIMETHOPRIM 400-80 MG PO TABS: 1.0000 | ORAL_TABLET | Freq: Two times a day (BID) | ORAL | 0 refills | Status: DC

## 2019-07-16 MED ORDER — CEFTRIAXONE SODIUM 1 G IJ SOLR
500.0000 mg | Freq: Once | INTRAMUSCULAR | Status: AC
  Administered 2019-07-16: 500 mg via INTRAMUSCULAR
  Filled 2019-07-16: qty 10

## 2019-07-16 MED ORDER — LIDOCAINE HCL (PF) 1 % IJ SOLN
INTRAMUSCULAR | Status: AC
  Administered 2019-07-16: 1 mL
  Filled 2019-07-16: qty 30

## 2019-07-16 NOTE — ED Triage Notes (Signed)
As per [facility] pt had a mechanical fall, pt missed a step and fell on left side. Pt has tenderness on left flank and a abrasion on left forearm. No LOC

## 2019-07-17 ENCOUNTER — Telehealth (HOSPITAL_COMMUNITY): Payer: Self-pay | Admitting: Emergency Medicine

## 2019-07-17 DIAGNOSIS — Z7401 Bed confinement status: Secondary | ICD-10-CM | POA: Diagnosis not present

## 2019-07-17 DIAGNOSIS — W19XXXA Unspecified fall, initial encounter: Secondary | ICD-10-CM | POA: Diagnosis not present

## 2019-07-17 DIAGNOSIS — R0902 Hypoxemia: Secondary | ICD-10-CM | POA: Diagnosis not present

## 2019-07-17 DIAGNOSIS — M2550 Pain in unspecified joint: Secondary | ICD-10-CM | POA: Diagnosis not present

## 2019-07-17 MED ORDER — SULFAMETHOXAZOLE-TRIMETHOPRIM 400-80 MG PO TABS: 1.0000 | ORAL_TABLET | Freq: Two times a day (BID) | ORAL | 0 refills | Status: AC

## 2019-07-18 LAB — URINE CULTURE
Culture: >=100000 — AB
Special Requests: NORMAL

## 2019-07-19 ENCOUNTER — Telehealth: Payer: Self-pay | Admitting: Emergency Medicine

## 2019-07-19 NOTE — ED Provider Notes (Signed)
[facility]-EMERGENCY DEPT Provider Note   CSN: [id number] Arrival date & time: 07/16/19  1959     History Chief Complaint  Patient presents with  . Fall    PEDER ALLUMS is a 86 y.o. male.  HPI    86 year old male comes in a chief complaint of fall. Patient has history of CHF, atrial fibrillation for which he is on blood thinners.  Patient is here with his wife.  Patient reports that while walking into his home, he tripped over a step and fell forward.  He fell onto his chest and abdomen and rolled to the left side.  Initially he was fine.  The fall occurred in the afternoon and he was able to get around his house.  In the evening however he was unable to get up from his chair because of severe pain.  The pain is located primarily in the upper part of his back and flank region.  Patient denies any headaches, numbness, tingling, nausea, vomiting, altered mental status.  He has no neck pain.  Patient also denies any abdominal pain.  Review of system is positive for urinary frequency.  Past Medical History:  Diagnosis Date  . Abnormal chest sounds   . Atrial fibrillation, chronic ([facility]) 10/22/2010   echo - EF >55%; impaired LV relaxation; LA mod dilated; RA mod dilated; mod mitral annular calcification; mod tricuspid regurgitation, RVsystolic pressure ;   . CHF (congestive heart failure) ([facility]) 07/24/2009   echo - EF 55-65%; severe LVH; severe concentric hypertrophy; grade 1 diastolic dysfunction; aortic valve - mod regurgitation; R pleural effusion  . Diastolic dysfunction   . Dyslipidemia   . Edema   . Essential hypertension, malignant   . Left ventricular hypertrophy    concentric  . Pleural effusion   . Pleural effusion on right   . RBBB 10/06/2008   Myoview - EF 65%; normal perfusion all regions, no evidence of inducible myocaridal ischemia; normal perfusion study  . Type II or unspecified type diabetes mellitus without mention of complication, not  stated as uncontrolled   . Varicose vein 03/20/2010   doppler - no evidence of thrombus, thrombophlebitis, or venous insufficiency    Patient Active Problem List   Diagnosis Date Noted  . Hyperlipidemia LDL goal <70 05/06/2015  . Chronic atrial fibrillation ([facility]) 05/16/2014  . Type 2 diabetes mellitus ([facility]) 11/10/2013  . HTN (hypertension) 09/17/2012  . RBBB 09/17/2012  . Long term current use of anticoagulant therapy 03/31/2012  . Atrial fibrillation ([facility]) 04/24/2010  . Diastolic dysfunction 04/24/2010  . Pleural effusion on right 04/22/2010    Past Surgical History:  Procedure Laterality Date  . HEMORROIDECTOMY    . VIDEO ASSISTED THORACOSCOPY     rt VATs , drainage of plueral effusion, pleural biopsies, talc pleurodesis       History reviewed. No pertinent family history.  Social History   Tobacco Use  . Smoking status: Never Smoker  . Smokeless tobacco: Never Used  Substance Use Topics  . Alcohol use: Yes    Alcohol/week: 0.0 standard drinks    Comment: Occasional  . Drug use: No    Home Medications Prior to Admission medications   Medication Sig Start Date End Date Taking? Authorizing Provider  alendronate (FOSAMAX) 35 MG tablet Take 35 mg by mouth every 7 (seven) days. Take with a full glass of water on an empty stomach.    [provider]  allopurinol (ZYLOPRIM) 100 MG tablet Take 100 mg by mouth  daily.     [provider]  amLODipine (NORVASC) 5 MG tablet TAKE 1 TAB BY MOUTH ONCE DAILY. NEEDS APPT FOR MORE REFILLS. 01/22/15   Lennette Bihari, MD  apixaban (ELIQUIS) 5 MG TABS tablet Take 5 mg by mouth 2 (two) times daily.    [provider]  atorvastatin (LIPITOR) 20 MG tablet TAKE ONE TABLET BY MOUTH ONE TIME DAILY 12/19/14   Lennette Bihari, MD  colchicine 0.6 MG tablet Take 0.6 mg by mouth 3 (three) times daily as needed. 05/05/13   [provider]  furosemide (LASIX) 40 MG tablet Take 1 tablet (40 mg total) by mouth daily.  05/04/15   Lennette Bihari, MD  glipiZIDE (GLUCOTROL) 10 MG 24 hr tablet Take 10 mg by mouth daily.     [provider]  losartan (COZAAR) 100 MG tablet TAKE ONE TABLET BY MOUTH ONE TIME DAILY 01/22/15   Lennette Bihari, MD  metFORMIN (GLUCOPHAGE-XR) 500 MG 24 hr tablet Take 1 tablet by mouth 1 day or 1 dose.  08/30/10   [provider]  metoprolol succinate (TOPROL-XL) 25 MG 24 hr tablet TAKE 1/2 TABLET BY MOUTH EVERY DAY 05/12/19   Lennette Bihari, MD  sulfamethoxazole-trimethoprim (BACTRIM) 400-80 MG tablet Take 1 tablet by mouth 2 (two) times daily. 07/17/19   Fayrene Helper, PA-C  tamsulosin (FLOMAX) 0.4 MG CAPS capsule Take 0.4 mg by mouth daily after supper.    [provider]    Allergies    Penicillins  Review of Systems   Review of Systems  Constitutional: Positive for activity change.  Respiratory: Negative for shortness of breath.   Cardiovascular: Negative for chest pain.  Gastrointestinal: Negative for abdominal pain.  Genitourinary: Positive for flank pain.  Musculoskeletal: Positive for back pain.  Neurological: Negative for headaches.  Hematological: Bruises/bleeds easily.  All other systems reviewed and are negative.   Physical Exam Updated Vital Signs BP (!) 144/84 (BP Location: Left Arm)   Pulse 88   Temp 97.7 F (36.5 C) (Oral)   Resp 17   Ht 5\' 9"  (1.753 m)   Wt 108 kg   SpO2 94%   BMI 35.16 kg/m   Physical Exam Vitals and nursing note reviewed.  Constitutional:      Appearance: He is well-developed.  HENT:     Head: Atraumatic.  Eyes:     Pupils: Pupils are equal, round, and reactive to light.  Neck:     Comments: No midline c-spine tenderness, pt able to turn head to 45 degrees bilaterally without any pain and able to flex neck to the chest and extend without any pain or neurologic symptoms.  Cardiovascular:     Rate and Rhythm: Normal rate.  Pulmonary:     Effort: Pulmonary effort is normal.  Abdominal:     Tenderness:  There is no abdominal tenderness.     Comments: No tenderness over the epigastric or upper abdominal quadrants.  Musculoskeletal:     Cervical back: Neck supple.     Comments: Patient is noted to have tenderness over the bilateral flank region, left worse than right.  The tenderness is present over the lateral chest as well.  No bruising appreciated over the torso.  Skin:    General: Skin is warm.  Neurological:     Mental Status: He is alert and oriented to person, place, and time.     ED Results / Procedures / Treatments   Labs (all labs ordered are listed,  but only abnormal results are displayed) Labs Reviewed  URINE CULTURE - Abnormal; Notable for the following components:      Result Value   Culture   (*)    Value: >=100,000 COLONIES/mL GROUP B STREP(S.AGALACTIAE)ISOLATED TESTING AGAINST S. AGALACTIAE NOT ROUTINELY PERFORMED DUE TO PREDICTABILITY OF AMP/PEN/VAN SUSCEPTIBILITY. Performed at [facility], [address], [place], [place] [address]    All other components within normal limits  URINALYSIS, ROUTINE W REFLEX MICROSCOPIC - Abnormal; Notable for the following components:   APPearance TURBID (*)    Hgb urine dipstick MODERATE (*)    Protein, ur 100 (*)    Leukocytes,Ua LARGE (*)    All other components within normal limits  URINALYSIS, MICROSCOPIC (REFLEX) - Abnormal; Notable for the following components:   Bacteria, UA FEW (*)    All other components within normal limits    EKG None  Radiology No results found.  Procedures Procedures (including critical care time)  Medications Ordered in ED Medications  cefTRIAXone (ROCEPHIN) injection 500 mg (500 mg Intramuscular Given 07/16/19 2343)  lidocaine (PF) (XYLOCAINE) 1 % injection (1 mL  Given 07/16/19 2343)    ED Course  I have reviewed the triage vital signs and the nursing notes.  Pertinent labs & imaging results that were available during my care of the patient were reviewed by me and considered in  my medical decision making (see chart for details).    MDM Rules/Calculators/A&P                          86 year old comes in a chief complaint of fall. DDx includes: - Mechanical falls - ICH - Fractures - Contusions - Soft tissue injury  Patient is on blood thinners.  The fall occurred few hours back.  His primary complaint is pain in his left flank region.  On exam he is noted to have tenderness over the anterior, lateral and flank region on the left side.  Abdominal exam is benign.  He is on blood thinner therefore we considered intra-abdominal bleeding in the differential.  However patient has no ecchymosis and no tenderness on exam.  The injury occurred few hours back, we would have expected little bit more discomfort and signs of trauma there was severe intra-abdominal bleeding along with hemodynamic instability.  In this case we will proceed with CT head without contrast to ensure there is no intracranial bleed and also CT chest without contrast to evaluate for any occult pneumothorax or rib fractures.  X-ray did not reveal any fracture.  Patient's UA is positive for leukocytes and bacteria.  He is complaining of urinary frequency.  We will treat him for UTI.  Final Clinical Impression(s) / ED Diagnoses Final diagnoses:  Urinary tract infection without hematuria, site unspecified  Fall, initial encounter  Pericardial effusion    Rx / DC Orders ED Discharge Orders         Ordered    sulfamethoxazole-trimethoprim (BACTRIM) 400-80 MG tablet  2 times daily,   Status:  Discontinued     Reprint     07/16/19 5284           Derwood Kaplan, MD 07/19/19 [phone]

## 2019-07-19 NOTE — Telephone Encounter (Signed)
Post ED Visit - Positive Culture Follow-up: Successful Patient Follow-Up  Culture assessed and recommendations reviewed by:  []  , Pharm.D. []  Enzo Bi, Pharm.D., BCPS AQ-ID []  , Pharm.D., BCPS []  Celedonio Miyamoto, Pharm.D., BCPS []  Sym[place], Garvin Fila.D., BCPS, AAHIVP []  , Pharm.D., BCPS, AAHIVP []  Georgina Pillion, PharmD, BCPS []  , PharmD, BCPS []  [place], PharmD, BCPS []  [place], PharmD PharmD  Positive urine culture  []  Patient discharged without antimicrobial prescription and treatment is now indicated [x]  Organism is resistant to prescribed ED discharge antimicrobial []  Patient with positive blood cultures  Changes discussed with ED provider: Estella Husk PA New antibiotic prescription stop bactrim DS , start Keflex 500mg  po q 8 hours x 5 days Called to and [employer]  Contacted wife 07/19/2019 1200   Phillips Climes 07/19/2019, 12:01 PM

## 2019-07-19 NOTE — Progress Notes (Signed)
ED Antimicrobial Stewardship Positive Culture Follow Up   Alex [name] is an 86 y.o. male who presented to [facility] on 07/16/2019 with a chief complaint of  Chief Complaint  Patient presents with  . Fall    Recent Results (from the past 720 hour(s))  Urine culture     Status: Abnormal   Collection Time: 07/16/19  8:32 PM   Specimen: Urine, Clean Catch  Result Value Ref Range Status   Specimen Description   Final    URINE, CLEAN CATCH Performed at [facility], [address] [place], [place] [address]    Special Requests   Final    Normal Performed at [facility], [address] [place], [place] [address]    Culture (A)  Final    >=100,000 COLONIES/mL GROUP B STREP(S.AGALACTIAE)ISOLATED TESTING AGAINST S. AGALACTIAE NOT ROUTINELY PERFORMED DUE TO PREDICTABILITY OF AMP/PEN/VAN SUSCEPTIBILITY. Performed at [facility], [address] [place], [place] [address]    Report Status 07/18/2019 FINAL  Final    [x]  Treated with Septra, organism resistant to prescribed antimicrobial []  Patient discharged originally without antimicrobial agent and treatment is now indicated  New antibiotic prescription:  - Stop sulfamethoxazole-trimethoprim - Start cephalexin 500 mg PO q8h x 5 days  ED Provider: , PA-C     , PharmD, BCPS 07/19/2019 11:17 AM

## 2019-08-10 DIAGNOSIS — H209 Unspecified iridocyclitis: Secondary | ICD-10-CM | POA: Diagnosis not present

## 2019-09-24 ENCOUNTER — Other Ambulatory Visit: Payer: Self-pay | Admitting: Cardiovascular Disease

## 2019-10-17 DIAGNOSIS — I480 Paroxysmal atrial fibrillation: Secondary | ICD-10-CM | POA: Diagnosis not present

## 2019-10-17 DIAGNOSIS — M810 Age-related osteoporosis without current pathological fracture: Secondary | ICD-10-CM | POA: Diagnosis not present

## 2019-10-17 DIAGNOSIS — E1169 Type 2 diabetes mellitus with other specified complication: Secondary | ICD-10-CM | POA: Diagnosis not present

## 2019-10-17 DIAGNOSIS — D692 Other nonthrombocytopenic purpura: Secondary | ICD-10-CM | POA: Diagnosis not present

## 2019-10-17 DIAGNOSIS — I5032 Chronic diastolic (congestive) heart failure: Secondary | ICD-10-CM | POA: Diagnosis not present

## 2019-10-17 DIAGNOSIS — Z23 Encounter for immunization: Secondary | ICD-10-CM | POA: Diagnosis not present

## 2019-10-17 DIAGNOSIS — I10 Essential (primary) hypertension: Secondary | ICD-10-CM | POA: Diagnosis not present

## 2019-11-14 DIAGNOSIS — Z111 Encounter for screening for respiratory tuberculosis: Secondary | ICD-10-CM | POA: Diagnosis not present

## 2019-11-16 DIAGNOSIS — Z111 Encounter for screening for respiratory tuberculosis: Secondary | ICD-10-CM | POA: Diagnosis not present

## 2019-11-21 ENCOUNTER — Encounter: Payer: Self-pay | Admitting: Family Medicine

## 2019-11-21 DIAGNOSIS — M25519 Pain in unspecified shoulder: Secondary | ICD-10-CM

## 2019-11-21 MED ORDER — ACETAMINOPHEN 500 MG PO TABS: 1000.0000 mg | ORAL_TABLET | Freq: Three times a day (TID) | ORAL | 0 refills | Status: AC | PRN
